# Patient Record
Sex: Female | Born: 1975 | Race: Black or African American | Hispanic: No | Marital: Single | Smoking: Never smoker
Health system: Southern US, Community
[De-identification: ages and names within clinical notes are randomized; demographics above are authoritative.]

## PROBLEM LIST (undated history)

## (undated) ENCOUNTER — Emergency Department: Discharge status: Absent without leave | Payer: Self-pay

## (undated) DIAGNOSIS — J189 Pneumonia, unspecified organism: Secondary | ICD-10-CM

## (undated) DIAGNOSIS — I2510 Atherosclerotic heart disease of native coronary artery without angina pectoris: Secondary | ICD-10-CM

## (undated) DIAGNOSIS — M1990 Unspecified osteoarthritis, unspecified site: Secondary | ICD-10-CM

## (undated) DIAGNOSIS — I10 Essential (primary) hypertension: Secondary | ICD-10-CM

## (undated) DIAGNOSIS — F32A Depression, unspecified: Secondary | ICD-10-CM

## (undated) DIAGNOSIS — F329 Major depressive disorder, single episode, unspecified: Secondary | ICD-10-CM

## (undated) HISTORY — PX: SHOULDER ARTHROSCOPY W/ ROTATOR CUFF REPAIR: SHX2400

## (undated) HISTORY — DX: Major depressive disorder, single episode, unspecified: F32.9

## (undated) HISTORY — DX: Unspecified osteoarthritis, unspecified site: M19.90

## (undated) HISTORY — DX: Pneumonia, unspecified organism: J18.9

## (undated) HISTORY — DX: Essential (primary) hypertension: I10

## (undated) HISTORY — DX: Depression, unspecified: F32.A

## (undated) HISTORY — PX: CARDIAC CATHETERIZATION: SHX172

## (undated) HISTORY — PX: ABDOMINAL HYSTERECTOMY: SHX81

---

## 1997-12-16 ENCOUNTER — Emergency Department (HOSPITAL_COMMUNITY): Admission: EM | Admit: 1997-12-16 | Discharge: 1997-12-16 | Payer: Self-pay | Admitting: Emergency Medicine

## 1999-07-20 ENCOUNTER — Emergency Department (HOSPITAL_COMMUNITY): Admission: EM | Admit: 1999-07-20 | Discharge: 1999-07-20 | Payer: Self-pay | Admitting: Emergency Medicine

## 1999-07-20 ENCOUNTER — Encounter: Payer: Self-pay | Admitting: Emergency Medicine

## 2002-05-29 ENCOUNTER — Emergency Department (HOSPITAL_COMMUNITY): Admission: EM | Admit: 2002-05-29 | Discharge: 2002-05-29 | Payer: Self-pay | Admitting: Emergency Medicine

## 2002-10-27 ENCOUNTER — Emergency Department (HOSPITAL_COMMUNITY): Admission: EM | Admit: 2002-10-27 | Discharge: 2002-10-28 | Payer: Self-pay | Admitting: Emergency Medicine

## 2003-02-08 ENCOUNTER — Emergency Department (HOSPITAL_COMMUNITY): Admission: EM | Admit: 2003-02-08 | Discharge: 2003-02-08 | Payer: Self-pay | Admitting: Emergency Medicine

## 2003-05-01 ENCOUNTER — Inpatient Hospital Stay (HOSPITAL_COMMUNITY): Admission: EM | Admit: 2003-05-01 | Discharge: 2003-05-02 | Payer: Self-pay | Admitting: Emergency Medicine

## 2003-12-05 ENCOUNTER — Emergency Department (HOSPITAL_COMMUNITY): Admission: EM | Admit: 2003-12-05 | Discharge: 2003-12-05 | Payer: Self-pay | Admitting: *Deleted

## 2004-12-11 ENCOUNTER — Emergency Department (HOSPITAL_COMMUNITY): Admission: EM | Admit: 2004-12-11 | Discharge: 2004-12-11 | Payer: Self-pay | Admitting: Family Medicine

## 2005-08-02 ENCOUNTER — Encounter: Admission: RE | Admit: 2005-08-02 | Discharge: 2005-08-02 | Payer: Self-pay | Admitting: Internal Medicine

## 2005-09-18 ENCOUNTER — Ambulatory Visit (HOSPITAL_COMMUNITY): Admission: RE | Admit: 2005-09-18 | Discharge: 2005-09-20 | Payer: Self-pay | Admitting: Obstetrics and Gynecology

## 2005-09-18 ENCOUNTER — Encounter (INDEPENDENT_AMBULATORY_CARE_PROVIDER_SITE_OTHER): Payer: Self-pay | Admitting: *Deleted

## 2005-11-03 ENCOUNTER — Ambulatory Visit (HOSPITAL_COMMUNITY): Admission: RE | Admit: 2005-11-03 | Discharge: 2005-11-03 | Payer: Self-pay | Admitting: Obstetrics and Gynecology

## 2005-12-19 ENCOUNTER — Encounter: Admission: RE | Admit: 2005-12-19 | Discharge: 2005-12-19 | Payer: Self-pay | Admitting: Internal Medicine

## 2006-06-18 ENCOUNTER — Emergency Department (HOSPITAL_COMMUNITY): Admission: EM | Admit: 2006-06-18 | Discharge: 2006-06-18 | Payer: Self-pay | Admitting: Emergency Medicine

## 2006-07-16 ENCOUNTER — Ambulatory Visit (HOSPITAL_COMMUNITY): Admission: RE | Admit: 2006-07-16 | Discharge: 2006-07-16 | Payer: Self-pay | Admitting: *Deleted

## 2006-07-16 ENCOUNTER — Encounter (INDEPENDENT_AMBULATORY_CARE_PROVIDER_SITE_OTHER): Payer: Self-pay | Admitting: Specialist

## 2006-07-22 ENCOUNTER — Ambulatory Visit (HOSPITAL_COMMUNITY): Admission: RE | Admit: 2006-07-22 | Discharge: 2006-07-22 | Payer: Self-pay | Admitting: *Deleted

## 2006-08-07 ENCOUNTER — Ambulatory Visit (HOSPITAL_COMMUNITY): Admission: RE | Admit: 2006-08-07 | Discharge: 2006-08-07 | Payer: Self-pay | Admitting: Cardiology

## 2007-06-22 IMAGING — CT CT ABDOMEN W/ CM
3 of 5 series · 15 of 46 positions shown, 20 images · IV contrast (omnipaque)
Comparison: None.

CLINICAL DATA: Lower chest and upper abdominal pain.   
 ABDOMEN CT WITH CONTRAST:
TECHNIQUE: Multidetector CT imaging of the abdomen was performed following the standard protocol during bolus administration of intravenous contrast.
 Contrast:  100 cc Omnipaque 300.

[Series 2: abdomen · axial · 0.72mm/px · z∈[-262,-56]mm · 11 of 49 slices shown, 16 images]
[im 4/49  soft-tissue]
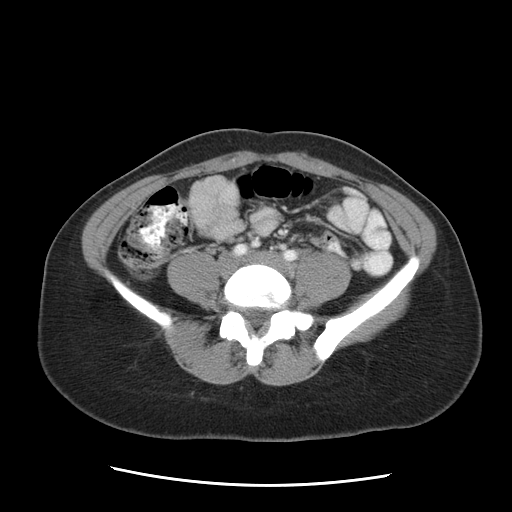
[im 4/49  bone]
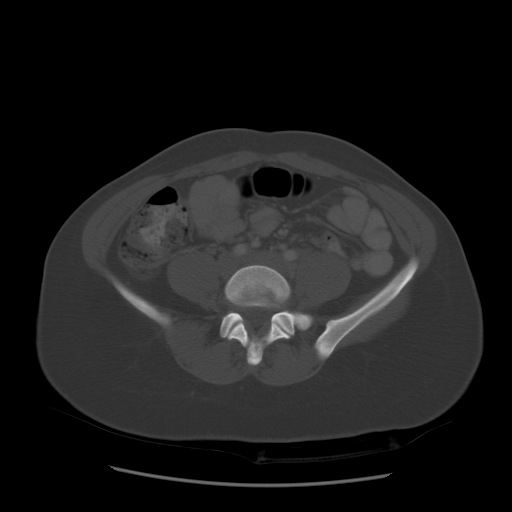
[im 10/49  soft-tissue]
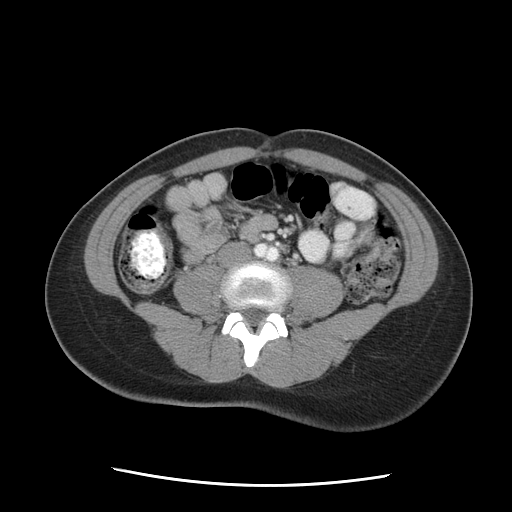
[im 13/49  soft-tissue]
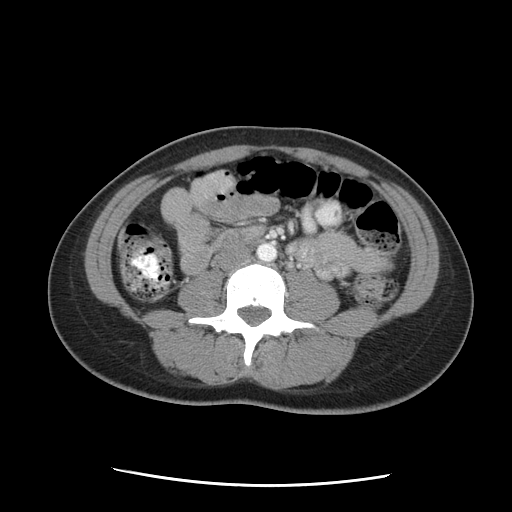
[im 17/49  soft-tissue]
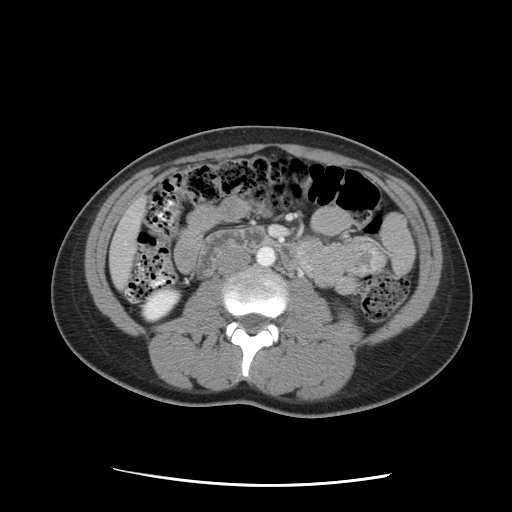
[im 23/49  soft-tissue]
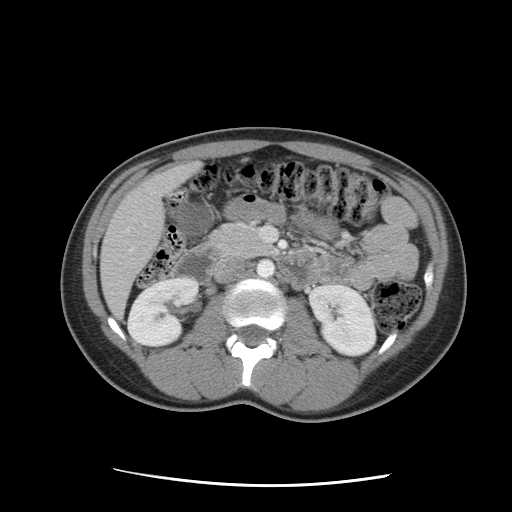
[im 26/49  soft-tissue]
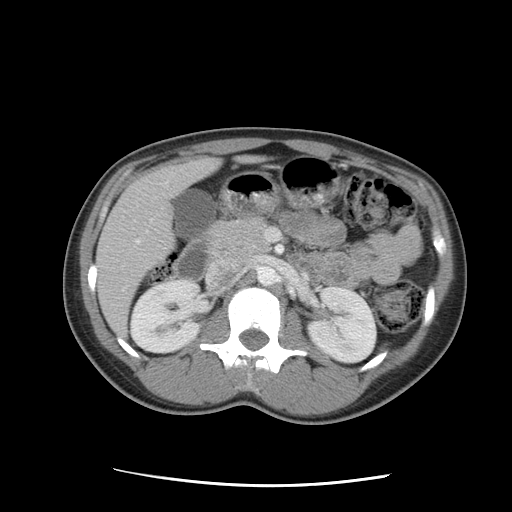
[im 33/49  soft-tissue]
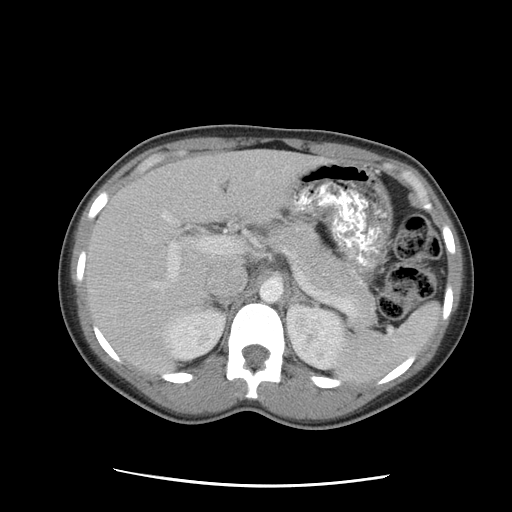
[im 36/49  soft-tissue]
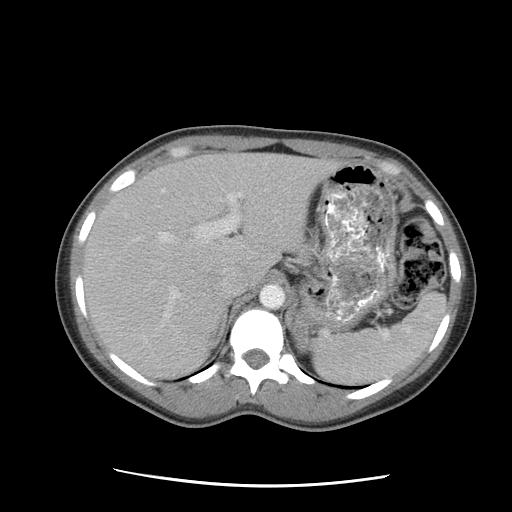
[im 36/49  lung]
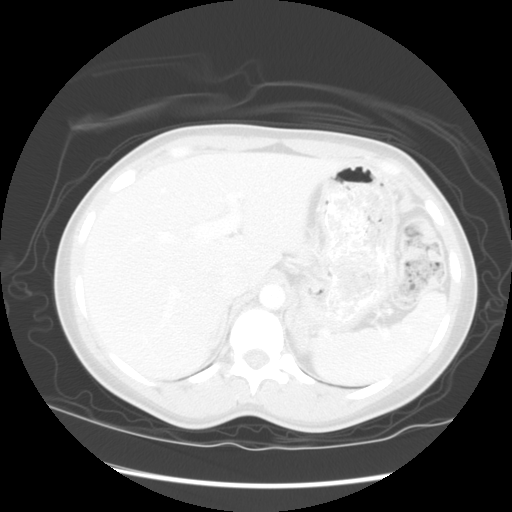
[im 39/49  soft-tissue]
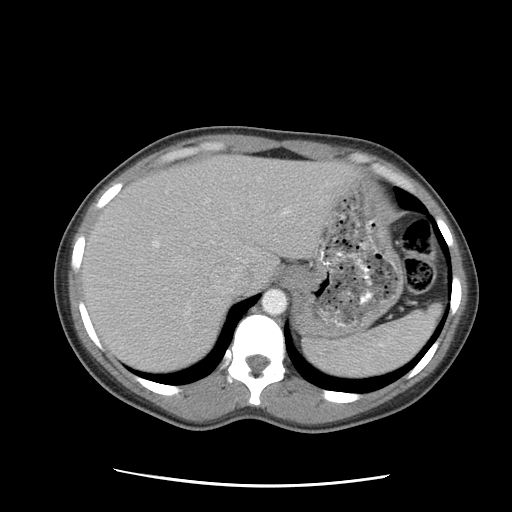
[im 39/49  lung]
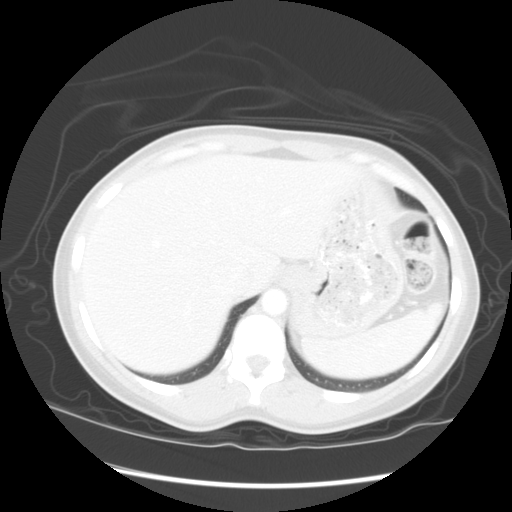
[im 39/49  bone]
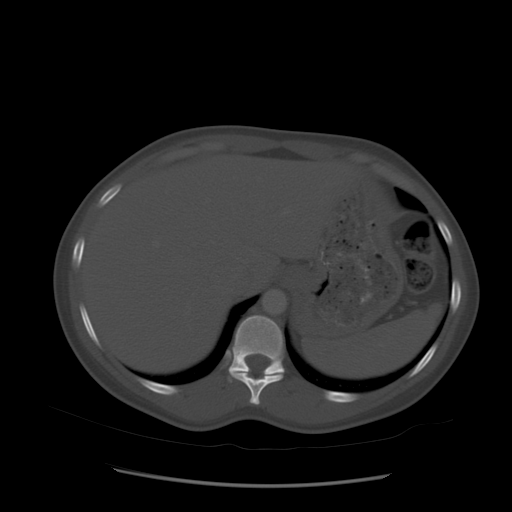
[im 42/49  lung]
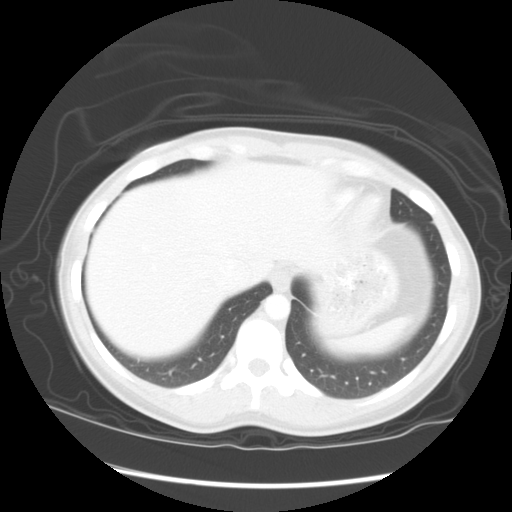
[im 45/49  soft-tissue]
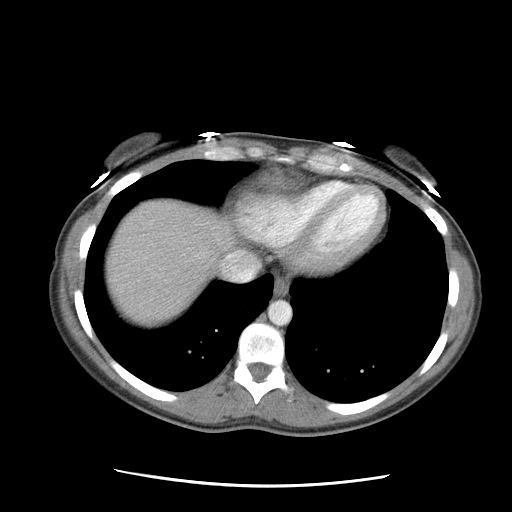
[im 45/49  lung]
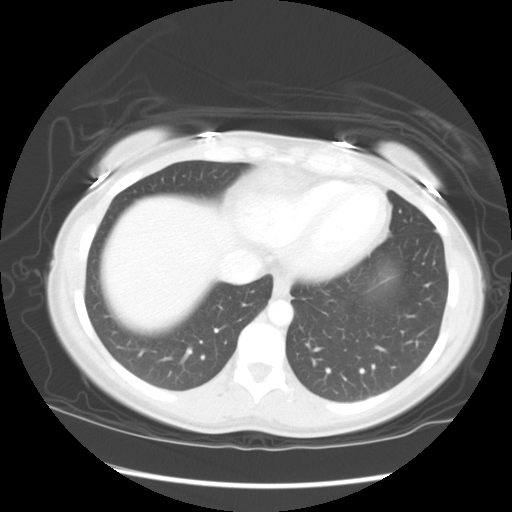

[Series 400: reformatted · sagittal · 0.72mm/px · 1 of 146 slices shown (1 of 2)]
[im 49/146  soft-tissue]
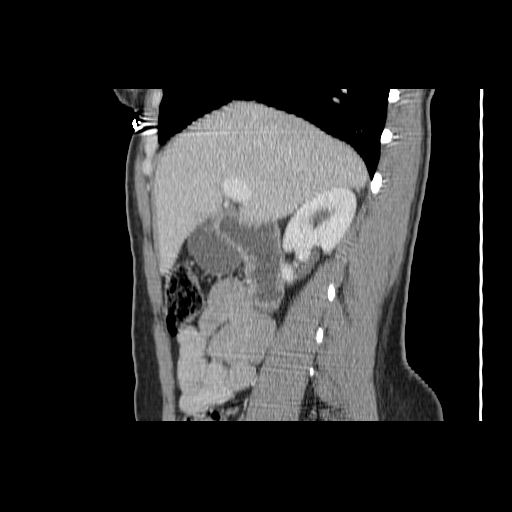

[Series 401: reformatted · coronal · 0.72mm/px · 3 of 104 slices shown (2 of 2)]
[im 35/104  soft-tissue]
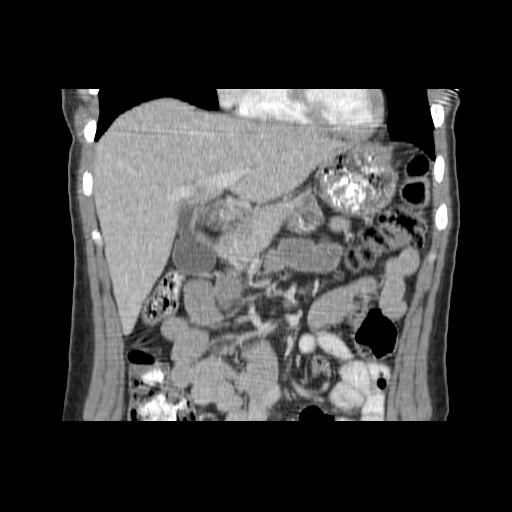
[im 46/104  soft-tissue]
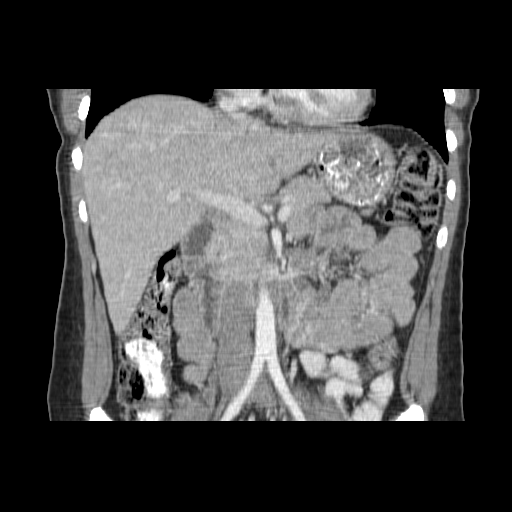
[im 58/104  soft-tissue]
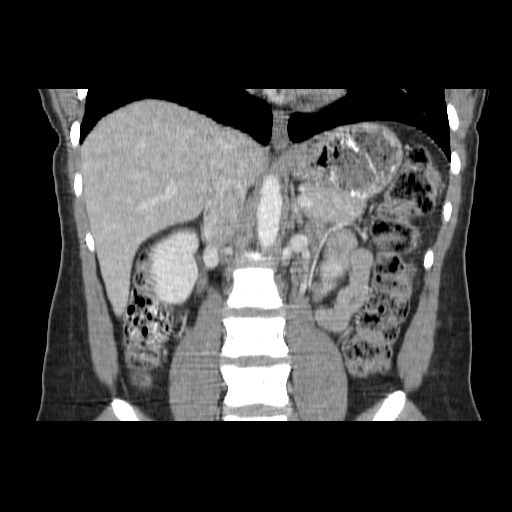

[15 of 46 positions shown; findings below may reference images not displayed]

FINDINGS: The liver, spleen, pancreas, adrenal glands, and kidneys appear normal.   There is a large amount of stool in the colon.   No mesenteric or retroperitoneal abnormality.  No mass or free fluid.
IMPRESSION: Unremarkable abdominal CT scan.  Constipation.

## 2007-07-08 ENCOUNTER — Other Ambulatory Visit: Admission: RE | Admit: 2007-07-08 | Discharge: 2007-07-08 | Payer: Self-pay | Admitting: Internal Medicine

## 2007-10-10 ENCOUNTER — Emergency Department (HOSPITAL_COMMUNITY): Admission: EM | Admit: 2007-10-10 | Discharge: 2007-10-10 | Payer: Self-pay | Admitting: Emergency Medicine

## 2008-03-14 ENCOUNTER — Emergency Department (HOSPITAL_COMMUNITY): Admission: EM | Admit: 2008-03-14 | Discharge: 2008-03-14 | Payer: Self-pay | Admitting: Emergency Medicine

## 2008-06-23 ENCOUNTER — Emergency Department (HOSPITAL_COMMUNITY): Admission: EM | Admit: 2008-06-23 | Discharge: 2008-06-23 | Payer: Self-pay | Admitting: Emergency Medicine

## 2010-11-15 NOTE — Op Note (Signed)
Michele Frazier, Michele Frazier              ACCOUNT NO.:  [id number]   MEDICAL RECORD NO.:  [id number]          PATIENT TYPE:  AMB   LOCATION:  DAY                          FACILITY:  [facility]   PHYSICIAN:  Malachi Pro. Ambrose Mantle, M.D. DATE OF BIRTH:  [birth year]   DATE OF PROCEDURE:  09/18/2005  DATE OF DISCHARGE:                                 OPERATIVE REPORT   PREOPERATIVE DIAGNOSES:  Leiomyomata uteri, menorrhagia, dysmenorrhea,  anemia.   POSTOPERATIVE DIAGNOSES:  Leiomyomata uteri, menorrhagia, dysmenorrhea,  anemia.   OPERATION:  Vaginal hysterectomy.   OPERATOR:  Malachi Pro. Ambrose Mantle, M.D.   ASSISTANT:  Zenaida Niece, M.D.   ANESTHESIA:  General.   DESCRIPTION OF PROCEDURE:  The patient was brought to the operating room and  placed under satisfactory general anesthesia. She was placed in lithotomy  position in the [place] stirrups. The abdomen, vulva, vagina and urethra were  prepped with Betadine solution. The urethra was catheterized with a Foley  catheter and hooked to straight drain. The exam revealed the uterus to be  multinodular, enlarged, the exact size of the uterus was hard to tell. The  adnexa seemed to be free of masses and the cul-de-sac felt smooth except for  slight irregularities but I did not think the rectum was adherent to the  lower part of the uterus. A weighted speculum was placed posteriorly, Deaver  retractor anteriorly and the cervicovaginal junction was at injected with a  dilute solution of Neo-Synephrine circumferentially. It was noted at this  point that the vagina was quite tight, the patient does have a 35 year old  child but she is homosexual and has no vaginal penile sex. A circumferential  incision was made around the cervix, the bladder was pushed anteriorly, the  posterior cul-de-sac was identified and entered with sharp dissection. A lot  of clear fluid was obtained. The slight nodularity in the posterior cul-de-  sac was secondary to small fibroids  on the posterior lower uterine segment  rather than anything in the cul-de-sac or on the rectum. The uterosacral  ligaments were bilaterally clamped, cut and suture ligated and held. The  cardinal ligaments were clamped, cut and suture ligated. I continued up the  sides of the uterus farther with clamping, cutting and suture ligating  technique. All suture was #0 Vicryl. The tissue under the bladder was cut  across the lower part of the uterus and the bladder was pushed anteriorly  and then by clamping and cutting and suture ligating higher up, I entered  the peritoneal cavity anteriorly. I continued up the sides of the uterus  farther but by using towel clips across the posterior part of the uterus,  the uterus did not budge so I had to continue going up the sides of the  broad ligament and finally I shelled out about a 3 cm fibroid on the right  anterior lower part of the uterus to get more exposure. This did provide  more exposure and I continued up the sides of the uterus and then was able  to invert the uterus in the incision in the cul-de-sac. The uterus  was quite  large, multinodular with many fibroids. The uterus had been removed. The  upper pedicles were clamped, cut and doubly suture ligated. Additional  sutures were required for complete hemostasis. The posterior vaginal cuff  was run with a locked suture of zero Vicryl. In spite of this, there were a  couple of extra bleeding sites that had to be sutured on the posterior cuff.  At this point, hemostasis appeared adequate, both tubes and ovaries appeared  normal. The pursestring suture of #1 Vicryl was started at 12 o'clock and  included the anterior peritoneum, the left upper pedicle, the left  uterosacral ligament, the posterior cul-de-sac peritoneum, the right  uterosacral ligament and the right upper pedicle and back to the anterior  peritoneum. I then sutured the uterosacral ligaments together in the midline  above the  pursestring suture then took one more look for hemostasis and  hemostasis was adequate. I pushed the bowel and ovaries away from the  pursestring suture and tied the pursestring suture down. I then tied the  uterosacral ligaments together in the midline and closed the vaginal cuff  vertically with interrupted figure-of-eight sutures of zero Vicryl.There  were two lacerations of the vagina caused by the retractors One required  suturing and this was done with 0 vicryl suture. The patient seemed to  tolerate the procedure well. The anesthetist estimated the blood loss at  100, I suggested 200. Sponge and needle counts were correct and the patient  was returned to recovery in satisfactory condition.      Malachi Pro. Ambrose Mantle, M.D.  Electronically Signed     TFH/MEDQ  D:  09/18/2005  T:  09/19/2005  Job:  [id number]

## 2010-11-15 NOTE — H&P (Signed)
Michele Frazier, Michele Frazier              ACCOUNT NO.:  [id number]   MEDICAL RECORD NO.:  [id number]          PATIENT TYPE:  AMB   LOCATION:  DAY                          FACILITY:  [facility]   PHYSICIAN:  Malachi Pro. Ambrose Mantle, M.D. DATE OF BIRTH:  1975/12/15   DATE OF ADMISSION:  09/18/2005  DATE OF DISCHARGE:                                HISTORY & PHYSICAL   PRESENT ILLNESS:  This is a 35 year old black female, para 1-0-1-1, who was  admitted to the hospital for hysterectomy because of leiomyomata uteri,  severe dysmenorrhea, severe menorrhagia, and anemia.  Last menstrual period,  September 05, 2005.  The patient's periods occur at slightly irregular intervals,  last 5-10 days, and are quite heavy in flow with severe pain.  The patient  states that approximately 2004, she noted the onset of severe menstrual  cramps and she rates the cramps at 11 out of 10 on a 1 to 10 scale.  She  states that her flow is very heavy, using 15-18 pads per day and passing  $0.50-size clots.  Her child was born in 66 and since 1998, there has been  no pregnancy.  She has used condoms but in the last three years she has been  in a same sex relationship.  She has no desire for future childbearing and  considers herself homosexual.  An ultrasound done, on August 25, 2005,  showed at least 10 fibroids.  The endometrial thickness was 5.10-cm and even  though this is 0.1-cm above the limit for confirming benignity of the  endometrium, I did not do a biopsy of her uterine cavity.  The patient's  ovaries appeared normal on the ultrasound.  The patient is admitted now for  hysterectomy.  She understands that the surgery could be avoided if she were  desperate to have another child.  She has no interest in future childbearing  and as I said earlier, considers herself homosexual.   PAST MEDICAL HISTORY:  No known allergies.   MEDICATIONS:  Benicar and Toprol.   ILLNESSES:  High blood pressure.  She does have heart  palpitations.   She has had no operations.   She rarely drinks alcohol and does not smoke.   Mother is 16 with high blood pressure and heart problems.  Father 51 with  high blood pressure.  One sister has a heart murmur and one brother is  living and well.   PHYSICAL EXAMINATION:  GENERAL:  A well-developed, well-nourished black  female in no acute distress.  VITAL SIGNS:  Blood pressure 132/68, pulse is 70, weight is 160 pounds.  HEENT:  Reveal no cranial abnormalities.  Extraocular movements are intact.  Nose and pharynx are clear.  NECK:  Supple without thyromegaly.  HEART:  Normal size and sounds.  No murmurs.  LUNGS:  Clear to auscultation.  BREASTS:  Soft without masses.  ABDOMEN:  Soft, not tender.  No masses are palpable.  There is a tattoo in  the left lower quadrant.  The liver, spleen, and kidneys are not felt.  PELVIC:  Vulva and vagina are clean.  The BUS is  negative.  The cervix is  clean.  The uterus is movable, irregular with fibroids.  The cul-de-sac  feels free.  There might be a small irregularity in the cul-de-sac but I  feel no evidence that the rectum is adherent to the lower uterine segment  posteriorly.  Adnexa are clear.   ADMITTING IMPRESSION:  1.  Menorrhagia.  2.  Dysmenorrhea.  3.  Fibroids.  4.  Anemia.   The patient has been placed on iron for the last six weeks or so, since her  hemoglobin was found to be 10.9, hematocrit 33 on August 11, 2005.  Her  pre-op hemoglobin has not increased, so it is possible that she does not  have iron-deficiency.  The patient is prepared for surgery.  She understands  the risks of surgery include, but are not limited to, heart attack, stroke,  pulmonary embolus, wound disruption, hemorrhage with need for reoperation  and/or transfusion, fistula formation, nerve injury, intestinal obstruction.  She understands and agrees to proceed.      Malachi Pro. Ambrose Mantle, M.D.  Electronically Signed     TFH/MEDQ  D:   09/17/2005  T:  09/17/2005  Job:  [id number]

## 2010-11-15 NOTE — Discharge Summary (Signed)
NAME:  Michele Frazier, Michele Frazier                        ACCOUNT NO.:  [id number]   MEDICAL RECORD NO.:  [id number]                   PATIENT TYPE:  INP   LOCATION:  2003                                 FACILITY:  [facility]   PHYSICIAN:  Charlton Haws, M.D.                  DATE OF BIRTH:  20-May-1976   DATE OF ADMISSION:  05/01/2003  DATE OF DISCHARGE:  05/02/2003                           DISCHARGE SUMMARY - REFERRING   DISCHARGE DIAGNOSIS:  Atypical chest pain with pressure sensation the last  few days prior to this admission. Cardiac enzymes negative x3.  Electrocardiogram nondiagnostic.   SECONDARY DIAGNOSES:  1. Hiatal hernia status post esophagogastroduodenoscopy.  2. Hypertension.  3. Positive family history of coronary artery disease.   PROCEDURE:  Computed tomogram of the chest, study results are pending at the  time of this discharge.   DISCHARGE DISPOSITION:  Michele Frazier is ready for discharge November  2. She is not having any further chest pain. She has been maintained on her  home medications of Toprol, Pepcid. Cardiac enzymes x3 are negative. She is  ready for discharge today November 2. She will have an outpatient exercise  study. This is to be performed Thursday May 11, 2003 at 11:30 in the  morning. She is counseled not to have anything to eat Wednesday November 10  and to hold off on taking her Toprol-XL the morning of November 11. She will  also have a 2-D echocardiogram at [facility] office  Tuesday May 09, 2003 at 3:30 in the afternoon. The patient will follow  up with Dr. Eden Emms Wednesday, May 17, 2003 at 9:00 in the morning.   DISCHARGE MEDICATIONS:  She goes home on the following medications:  1. Enteric-coated aspirin 325 mg daily.  2. Toprol-XL 50 mg daily.  3. Pepcid 20 mg daily.   DISCHARGE DIET:  Low sodium, low cholesterol diet.   BRIEF HISTORY:  Michele Frazier is a 35 year old female who has presented to  [facility] emergency room with chest pain. Her primary care giver  is Dr. Frederich Chick. She presented with sharp chest pain over the past few days  with pressure sensation. She has no prior cardiac history. She does have a  history of nausea and some esophageal type pain. She has not had a previous  cardiac catheterization. She had a previous EGD for hiatal hernia. Coronary  risk factors include hypertension, positive family history of coronary  artery disease. She is on Toprol 50 mg XL and some medication for nausea.  Does not apparently tolerate Nexium. Her pain is described as positional and  sometimes exertional. She denies tobacco or drug history. She reports not  being pregnant but a pregnancy will be checked. This level is also pending.   HOSPITAL COURSE:  After admission November 1 for chest pain with pressure  sensation which has been occurring over the last few days prior  to this  admission, cardiac enzymes x3 were taken. A computed tomogram of the chest  was also performed. Pregnancy evaluation performed. The CT of the chest and  the pregnancy evaluation are pending. Cardiac enzymes are as follows:  November 1 at 11:41 a.m. myoglobin 43.9, CK-MB less than 1.0, troponin I  less than 0.05; on November 1 at 16:15 hours, CK of 69, CK-MB 0.5, troponin  I less than 0.01; on November 2 in the morning, CK was 63, CK-MB 0.3,  troponin I less than 0.01. The patient's weight is 160 pounds. Chest x-ray  in the emergency room showed no acute abnormality. Admission electrolytes on  November 1:  Sodium 138, potassium 3.8, chloride 108, carbon dioxide 26,  glucose 93, BUN 15, creatinine 0.9; alkaline phosphatase 46, SGOT 18, SGPT  10. ESR is 6. It does not look as if a complete blood count was obtained.  Hemoglobin, however, is 13, hematocrit 39%.      [name], P.A.                    Charlton Haws, M.D.    GM/MEDQ  D:  05/02/2003  T:  05/02/2003  Job:  [id number]   cc:   Dan Humphreys, M.D.   [facility]   Frederich Chick, M.D.

## 2010-11-15 NOTE — Cardiovascular Report (Signed)
NAMEMELISA, DONOFRIO NO.:  [id number]   MEDICAL RECORD NO.:  [id number]          PATIENT TYPE:  OIB   LOCATION:  2854                         FACILITY:  [facility]   PHYSICIAN:  Antionette Char, MD    DATE OF BIRTH:  May 02, 1976   DATE OF PROCEDURE:  08/07/2006  DATE OF DISCHARGE:  08/07/2006                            CARDIAC CATHETERIZATION   PROCEDURES:  1. Left heart catheterization  2. Coronary cineangiography.  3. Left ventricular cineangiography.  4. Angio-Seal of the right femoral artery  5. Intracoronary nitroglycerin in right coronary artery.   INDICATIONS FOR PROCEDURE:  This 35 year old female has a very strong  family history of coronary artery disease with her mother dying suddenly  with a massive heart attack at age 7. She was a nonsmoker.  The patient  recently had the onset of chest pain for two months and underwent a  presenting Cardiolite study which showed evidence for reversible  myocardial ischemia.  She was then scheduled for cardiac cath because of  her strong family history, her recent onset of chest pain, and abnormal  cardiac stress test.   DESCRIPTION OF PROCEDURE:  After signing an informed consent, the  patient was premedicated with 5 mg of Valium by mouth and brought to the  cardiac catheterization lab at [facility].  Her right groin was  prepped and draped in a sterile fashion and anesthetized locally with 1%  lidocaine.  A 6-French introducer sheath was inserted percutaneously  into the right femoral artery.  6-French #4 Judkins coronary catheters  were used to make injections into the native coronary arteries.  The 6-  French right coronary catheter tip caused significant spasm in the  proximal right coronary artery and this catheter was exchanged for a 4-  [place] Judkins right coronary catheter. After engaging the ostium of the  right coronary artery with a 4-French catheter, injections were again  made noting the  spasm and this was followed by 200 mcg of intracoronary  nitroglycerin injection. Following the injection, the spasm was relieved  and further injection showed normalization of the artery with normal  flow and normal runoff.  A 6-French pigtail catheter was used to measure  pressures in the left ventricle and aorta and to make a midstream  injection into the left ventricle.  The patient tolerated the procedure  well and no complications were noted. At the end of the procedure, the  catheter and sheath were removed from the right femoral artery and  hemostasis was easily obtained with an Angio-Seal closure system.   MEDICATIONS GIVEN:  Nitroglycerin intracoronary artery right coronary  artery 200 mcg.   HEMODYNAMIC DATA:  There was no gradient across the aortic valve.   CINE FINDINGS:  Coronary cineangiography:  Left coronary artery showed  the ostium and left main appear normal. Left anterior descending appears  normal. Circumflex coronary artery appears normal.  The right coronary  artery initial injections showed spasm at the tip of the right coronary  catheter.  Otherwise, the right coronary artery was normal in  appearance. Further follow-up injections showed changing of the  6-French  catheter to a 4-French catheter and following nitroglycerin injection,  the cine showed normalization of the proximal segment without residual  spasm and with normal appearing vessel with normal antegrade flow and  normal distal runoff.   LEFT VENTRICULAR CINE ANGIOGRAM:  Left ventricular chamber size,  contractility, and wall thickness appear normal.  The left ventricular  ejection fraction is very normal with an ejection fraction estimated at  70-80%. The mitral and aortic valves appear normal.   FINAL DIAGNOSIS:  1. Normal coronary arteries.  2. Catheter tip induced spasm in the proximal right coronary artery      totally relieved with intracoronary nitroglycerin.  3. Normal left ventricular  function.  4. Successful Angio-Seal of the right femoral artery.   DISPOSITION:  Will monitor in the holding area and short stay until  stable and then discharge to home.  Will arrange follow-up in the office  with Dr. Ricki Miller for continued medical treatment and evaluation.      Antionette Char, MD  Electronically Signed     JRT/MEDQ  D:  08/07/2006  T:  08/08/2006  Job:  [id number]

## 2010-11-15 NOTE — Consult Note (Signed)
NAME:  Michele Frazier, Michele Frazier                        ACCOUNT NO.:  [id number]   MEDICAL RECORD NO.:  [id number]                   PATIENT TYPE:  EMS   LOCATION:  MAJO                                 FACILITY:  [facility]   PHYSICIAN:  Charlton Haws, M.D.                  DATE OF BIRTH:  12-17-1975   DATE OF CONSULTATION:  DATE OF DISCHARGE:                                   CONSULTATION   Michele Frazier is a 35 year old patient seen in the nonurgent care part of the  ER for chest pain.  Her primary care M.D. is Dr. Frederich Chick.  She presented  with sharp chest pain over the last few days with a pressure sensation.   She has no previous cardiac history.  She does have a history of some nausea  and esophageal type pain.   She has not had a previous catheterization before.  She has had previous EGD  for hiatal hernia.   Coronary risk factors include hypertension, positive family history for  coronary disease.  She was on Toprol 50 mg XL and some medication for  nausea.  She apparently does not tolerate Nexium.  [place]  She is currently on disability.   Her pain is described as positional and/or exertional.   She denies any tobacco or drug history.  She reports not being pregnant but  we will check a pregnancy level.   PHYSICAL EXAMINATION:  GENERAL APPEARANCE:  She is in no distress.  There is  a tattoo on the right arm.  VITAL SIGNS:  Blood pressure is 133/73, pulse is 60 and regular.  NECK:  Carotids are normal.  LUNGS:  Clear.  CARDIOVASCULAR:  There is an S1 and S2 with normal heart sounds.  There is  no murmur, rub, gallop or click.  ABDOMEN:  Benign.  EXTREMITIES:  Intact pulses with no edema.   Blood work is unremarkable.   Her EKG shows sinus rhythm with occasional PAC and PVC.  One EKG shows  question of an old anterior septal infarct but I believe that her chest  leads are a little bit high.  The patient's chest x-ray shows no active   disease.   IMPRESSION:  Somewhat atypical chest pain in a young person.  Will get a CT  scan to rule out other chest pathology in regard to her sharp pain.   She will have a pregnancy test before this.  Will do a 2-D echocardiogram  and I would be surprised if there is wall motion abnormality.  If she rules  out and her echo and CT scan are normal, I think she can be discharged in  the morning for an outpatient Cardiolite study.   We will continue her beta-blocker.   Further recommendations will be based on the results of these tests.  Charlton Haws, M.D.    PN/MEDQ  D:  05/01/2003  T:  05/01/2003  Job:  [id number]

## 2010-11-15 NOTE — Discharge Summary (Signed)
NAMEAVALEY, COOP NO.:  [id number]   MEDICAL RECORD NO.:  [id number]          PATIENT TYPE:  INP   LOCATION:  1613                         FACILITY:  [facility]   PHYSICIAN:  Malachi Pro. Ambrose Mantle, M.D. DATE OF BIRTH:  [birth year]   DATE OF ADMISSION:  09/18/2005  DATE OF DISCHARGE:  09/20/2005                                 DISCHARGE SUMMARY   This is a 35 year old black female admitted to the hospital for hysterectomy  because of leiomyomata uteri, menorrhagia, dysmenorrhea, and anemia.  The  patient underwent a vaginal hysterectomy with one fibroid morcellated out on  September 18, 2005, under general anesthesia.  The procedure was somewhat long  because the patient had a somewhat tight vagina probably owing to the fact  that she has been homosexual for years and the uterus was quite large.  The  uterus weighed 357 grams but was removed intact except for the 1 fibroid  that was enucleated to get better exposure on the right side of the broad  ligament.  Postoperatively, the patient did well.  She was considered a  candidate for discharge on the first postop day, but she did not feel well  enough to feel good about going home.  She was kept until the second postop  day, and now she feels very well and is ready for discharge.  She is  ambulating well, voiding well without difficulty, tolerating a liquid diet.  She has not passed flatus, but her abdomen is soft and nontender.  Comprehensive metabolic panel showed no abnormality except a glucose of 114  and a bilirubin of 1.7.  Initial hemoglobin 10.2, hematocrit 31.2, white  count 4700, platelet count 220,000, 51 segs, 38 lymphs, 10 monos, 1  eosinophil.  Urine pregnancy test was negative.  Follow-up hematocrits were  30.1 and 29.1.  On 3 February2007, the patient had undergone an MRI brain  scan with and without contrast because of headaches, nausea, and vomiting  and history of hypertension.  It showed no acute stroke, no  abnormal  intracranial enhancement, scattered punctate foci of mildly abnormal signal  in the subcortical white matter, left greater than right, complicated  migraine not excluded, vasculitis, small vessel disease, and MS appear  unlikely.  Chest x-ray showed no active cardiopulmonary disease.  An EKG  showed normal sinus rhythm.  The patient had undergone a stress Cardiolite  study in November2004, and in walking on a treadmill, there was no  significant chest pain and no diagnostic electrocardiogram changes.  Path  report has not been entered in the chart but by the computer shows that the  cervix, endometrium, and at least 15 leiomyomas were benign.  Uterine weight  was 357 grams.   FINAL DIAGNOSES:  1.  Leiomyomata uteri.  2.  Menorrhagia.  3.  Dysmenorrhea.  4.  Anemia.   OPERATION:  Vaginal hysterectomy.   FINAL CONDITION:  Improved.   Instructions include our regular discharge instructions.  No vaginal  entrance, no heavy lifting or strenuous activity, liquid diet until passing  flatus and then advance to a regular diet.  Call with  any temperature  elevation greater than 100.4 degrees.  Call with any heavy vaginal bleeding,  return to the office in 10-14 days for follow-up examination.  Percocet  5/325, 30 tablets 1-2 q.4-6h. as needed for pain is given at discharge.      Malachi Pro. Ambrose Mantle, M.D.  Electronically Signed     TFH/MEDQ  D:  09/20/2005  T:  09/23/2005  Job:  [id number]

## 2010-11-15 NOTE — Op Note (Signed)
Michele Frazier, Michele Frazier NO.:  [id number]   MEDICAL RECORD NO.:  [id number]          PATIENT TYPE:  AMB   LOCATION:  ENDO                         FACILITY:  [facility]   PHYSICIAN:  Georgiana Spinner, M.D.    DATE OF BIRTH:  1975/11/05   DATE OF PROCEDURE:  07/16/2006  DATE OF DISCHARGE:                               OPERATIVE REPORT   SURGEON:  Georgiana Spinner, M.D.   PROCEDURE:  Upper endoscopy.   INDICATIONS:  Abdominal pain   ANESTHESIA:  Fentanyl 60 mcg, Versed 6 mg.   PROCEDURE:  With the patient mildly sedated in the left lateral  decubitus position, the Pentax videoscopic endoscope was inserted in the  mouth and passed under direct vision through the esophagus, which  appeared normal, except there was 1 tongue of tissue that could be  Barrett or normal, which I could not determined; so I elected to  photograph and biopsy this.  We entered into the stomach.  The fundus,  body, antrum, duodenal bulb and second portion of the duodenum appeared  normal. From this point, the endoscope was slowly withdrawn, taking  circumferential views of the duodenal mucosa, until the endoscope was  then pulled back into the stomach and placed in retroflexion to view the  stomach from below.  The endoscope was straightened and withdrawn,  taking circumferential views of the remaining gastric and esophageal  mucosa.  The patient's vital signs and pulse oximetry remained stable.  The patient tolerated the procedure well without apparent complication.   FINDINGS:  Question of Barrett esophagus versus normal biopsy.   PLAN:  Await biopsy report.  The patient will call me for results and  follow up with me as an outpatient.  Will all schedule CT scan of the  abdomen to evaluate further.           ______________________________  Georgiana Spinner, M.D.     GMO/MEDQ  D:  07/16/2006  T:  07/16/2006  Job:  [id number]

## 2011-03-25 LAB — DIFFERENTIAL
Basophils Absolute: 0
Basophils Relative: 0
Eosinophils Absolute: 0.2
Eosinophils Relative: 3
Lymphocytes Relative: 40
Lymphs Abs: 2
Monocytes Absolute: 0.4
Monocytes Relative: 7
Neutro Abs: 2.4
Neutrophils Relative %: 50

## 2011-03-25 LAB — BASIC METABOLIC PANEL
BUN: 8
CO2: 28
Calcium: 9.1
Chloride: 107
Creatinine, Ser: 0.87
GFR calc Af Amer: >60
GFR calc non Af Amer: >60
Glucose, Bld: 99
Potassium: 3.8
Sodium: 142

## 2011-03-25 LAB — POCT CARDIAC MARKERS
CKMB, poc: <1 — ABNORMAL LOW
Myoglobin, poc: 28.7
Operator id: 4074
Troponin i, poc: <0.05

## 2011-03-25 LAB — CBC
HCT: 38.3
Hemoglobin: 13
MCHC: 33.9
MCV: 91.9
Platelets: 293
RBC: 4.17
RDW: 13.1
WBC: 5

## 2011-03-25 LAB — D-DIMER, QUANTITATIVE: D-Dimer, Quant: <0.22

## 2011-08-09 ENCOUNTER — Encounter (HOSPITAL_BASED_OUTPATIENT_CLINIC_OR_DEPARTMENT_OTHER): Payer: Self-pay | Admitting: *Deleted

## 2011-08-09 ENCOUNTER — Emergency Department (HOSPITAL_BASED_OUTPATIENT_CLINIC_OR_DEPARTMENT_OTHER)
Admission: EM | Admit: 2011-08-09 | Discharge: 2011-08-09 | Disposition: A | Discharge status: Home / Self Care | Payer: BC Managed Care – PPO | Attending: Emergency Medicine | Admitting: Emergency Medicine

## 2011-08-09 DIAGNOSIS — I2510 Atherosclerotic heart disease of native coronary artery without angina pectoris: Secondary | ICD-10-CM | POA: Insufficient documentation

## 2011-08-09 DIAGNOSIS — M545 Low back pain, unspecified: Secondary | ICD-10-CM | POA: Insufficient documentation

## 2011-08-09 DIAGNOSIS — Z79899 Other long term (current) drug therapy: Secondary | ICD-10-CM | POA: Insufficient documentation

## 2011-08-09 HISTORY — DX: Atherosclerotic heart disease of native coronary artery without angina pectoris: I25.10

## 2011-08-09 LAB — URINALYSIS, ROUTINE W REFLEX MICROSCOPIC
Bilirubin Urine: NEGATIVE
Glucose, UA: NEGATIVE mg/dL
Hgb urine dipstick: NEGATIVE
Ketones, ur: NEGATIVE mg/dL
Leukocytes, UA: NEGATIVE
Nitrite: NEGATIVE
Protein, ur: NEGATIVE mg/dL
Specific Gravity, Urine: 1.025 (ref 1.005–1.030)
Urobilinogen, UA: 1 mg/dL (ref 0.0–1.0)
pH: 6.5 (ref 5.0–8.0)

## 2011-08-09 MED ORDER — PREDNISONE 10 MG PO TABS: ORAL_TABLET | ORAL | Status: DC

## 2011-08-09 MED ORDER — OXYCODONE-ACETAMINOPHEN 5-325 MG PO TABS: 2.0000 | ORAL_TABLET | ORAL | Status: AC | PRN

## 2011-08-09 NOTE — ED Notes (Signed)
Pt presents to ED today with low back pain for 1 week.  Pt denies any flank pain or UTI sx.  Pt describes as tingling radiating down towards knees.

## 2011-08-09 NOTE — ED Provider Notes (Signed)
History     CSN: [id number]  Arrival date & time 08/09/11  1728   First MD Initiated Contact with Patient 08/09/11 1934      Chief Complaint  Patient presents with  . Back Pain    (Consider location/radiation/quality/duration/timing/severity/associated sxs/prior treatment) Patient is a 36 y.o. female presenting with back pain. The history is provided by the patient. No language interpreter was used.  Back Pain  This is a new problem. The current episode started more than 1 week ago. The problem has not changed since onset.The pain is associated with no known injury. The pain is present in the lumbar spine. The quality of the pain is described as stabbing. The pain does not radiate. The pain is at a severity of 7/10. The pain is moderate. The symptoms are aggravated by certain positions, twisting and bending. The pain is the same all the time. Stiffness is present all day. Pertinent negatives include no chest pain, no abdominal pain, no paresthesias, no paresis and no weakness. She has tried nothing for the symptoms.  Pt complains of pain in low back.  Pt denies any leg pain.  No numbness  Past Medical History  Diagnosis Date  . Coronary artery disease     Past Surgical History  Procedure Date  . Abdominal hysterectomy   . Cardiac catheterization     History reviewed. No pertinent family history.  History  Substance Use Topics  . Smoking status: Never Smoker   . Smokeless tobacco: Not on file  . Alcohol Use: Yes    OB History    Grav Para Term Preterm Abortions TAB SAB Ect Mult Living                  Review of Systems  Cardiovascular: Negative for chest pain.  Gastrointestinal: Negative for abdominal pain.  Musculoskeletal: Positive for back pain.  Neurological: Negative for weakness and paresthesias.  All other systems reviewed and are negative.    Allergies  Review of patient's allergies indicates no known allergies.  Home Medications   Current Outpatient  Rx  Name Route Sig Dispense Refill  . AMLODIPINE BESYLATE 5 MG PO TABS Oral Take 5 mg by mouth daily.    Marland Kitchen METOPROLOL TARTRATE 25 MG PO TABS Oral Take 50 mg by mouth daily.    . SERTRALINE HCL 50 MG PO TABS Oral Take 50 mg by mouth daily.    . SULFAMETHOXAZOLE-TMP DS 800-160 MG PO TABS Oral Take 1 tablet by mouth 2 (two) times daily.      There were no vitals taken for this visit.  Physical Exam  Nursing note and vitals reviewed. Constitutional: She is oriented to person, place, and time. She appears well-developed and well-nourished.  HENT:  Head: Normocephalic.  Neck: Normal range of motion.  Cardiovascular: Normal rate, regular rhythm and normal heart sounds.   Pulmonary/Chest: Effort normal.  Abdominal: Soft. Bowel sounds are normal.  Musculoskeletal: She exhibits tenderness. She exhibits no edema.       Tender ls spine,  Decreased range of motion,    Neurological: She is alert and oriented to person, place, and time.  Skin: Skin is warm.  Psychiatric: She has a normal mood and affect.    ED Course  Procedures (including critical care time)   Labs Reviewed  URINALYSIS, ROUTINE W REFLEX MICROSCOPIC   No results found.   No diagnosis found.    MDM  Pt advised to see Dr. Ricki Miller for recheck in 3-4 days.  Pt given Rx for prednisone and percocet.        Langston Masker, [place] 08/09/11 2004

## 2011-08-09 NOTE — ED Notes (Signed)
Pt describes low back and abd pain off and on x 1 month. Denies vaginal discharge. + frequency. Denies other s/s.

## 2011-08-10 NOTE — ED Provider Notes (Signed)
Medical screening examination/treatment/procedure(s) were performed by non-physician practitioner and as supervising physician I was immediately available for consultation/collaboration.    Zair Borawski L Daryll Spisak, MD 08/10/11 1317 

## 2013-08-23 ENCOUNTER — Encounter (HOSPITAL_BASED_OUTPATIENT_CLINIC_OR_DEPARTMENT_OTHER): Payer: Self-pay | Admitting: Emergency Medicine

## 2013-08-23 ENCOUNTER — Emergency Department (HOSPITAL_BASED_OUTPATIENT_CLINIC_OR_DEPARTMENT_OTHER)
Admission: EM | Admit: 2013-08-23 | Discharge: 2013-08-23 | Disposition: A | Discharge status: Home / Self Care | Payer: Managed Care, Other (non HMO) | Attending: Emergency Medicine | Admitting: Emergency Medicine

## 2013-08-23 ENCOUNTER — Emergency Department (HOSPITAL_BASED_OUTPATIENT_CLINIC_OR_DEPARTMENT_OTHER): Payer: Managed Care, Other (non HMO)

## 2013-08-23 DIAGNOSIS — I10 Essential (primary) hypertension: Secondary | ICD-10-CM

## 2013-08-23 DIAGNOSIS — Z9889 Other specified postprocedural states: Secondary | ICD-10-CM | POA: Insufficient documentation

## 2013-08-23 DIAGNOSIS — I2510 Atherosclerotic heart disease of native coronary artery without angina pectoris: Secondary | ICD-10-CM | POA: Insufficient documentation

## 2013-08-23 DIAGNOSIS — IMO0002 Reserved for concepts with insufficient information to code with codable children: Secondary | ICD-10-CM | POA: Insufficient documentation

## 2013-08-23 DIAGNOSIS — Z79899 Other long term (current) drug therapy: Secondary | ICD-10-CM | POA: Insufficient documentation

## 2013-08-23 LAB — BASIC METABOLIC PANEL
BUN: 15 mg/dL (ref 6–23)
CHLORIDE: 103 meq/L (ref 96–112)
CO2: 24 mEq/L (ref 19–32)
Calcium: 9.2 mg/dL (ref 8.4–10.5)
Creatinine, Ser: 0.9 mg/dL (ref 0.50–1.10)
GFR calc non Af Amer: 81 mL/min — ABNORMAL LOW (ref >90)
Glucose, Bld: 96 mg/dL (ref 70–99)
Potassium: 3.7 mEq/L (ref 3.7–5.3)
SODIUM: 140 meq/L (ref 137–147)

## 2013-08-23 LAB — CBC WITH DIFFERENTIAL/PLATELET
BASOS ABS: 0 10*3/uL (ref 0.0–0.1)
Basophils Relative: 0 % (ref 0–1)
Eosinophils Absolute: 0.1 10*3/uL (ref 0.0–0.7)
Eosinophils Relative: 2 % (ref 0–5)
HCT: 37 % (ref 36.0–46.0)
Hemoglobin: 12.4 g/dL (ref 12.0–15.0)
Lymphocytes Relative: 44 % (ref 12–46)
Lymphs Abs: 3.1 10*3/uL (ref 0.7–4.0)
MCH: 30.9 pg (ref 26.0–34.0)
MCHC: 33.5 g/dL (ref 30.0–36.0)
MCV: 92.3 fL (ref 78.0–100.0)
MONO ABS: 0.5 10*3/uL (ref 0.1–1.0)
Monocytes Relative: 7 % (ref 3–12)
NEUTROS ABS: 3.3 10*3/uL (ref 1.7–7.7)
Neutrophils Relative %: 47 % (ref 43–77)
PLATELETS: 254 10*3/uL (ref 150–400)
RBC: 4.01 MIL/uL (ref 3.87–5.11)
RDW: 11.8 % (ref 11.5–15.5)
WBC: 7.1 10*3/uL (ref 4.0–10.5)

## 2013-08-23 LAB — TROPONIN I

## 2013-08-23 NOTE — ED Provider Notes (Signed)
CSN: [id number]     Arrival date & time 08/23/13  1815 History   First MD Initiated Contact with Patient 08/23/13 1920     Chief Complaint  Patient presents with  . Hypertension   HPI Patient presents to emergency room with complaints of elevated blood pressure. She has been checking it at home and his been fluctuating significantly. She's also had trouble with occasional headache and dizziness. She's also had intermittent episodes of chest pain and has noticed swelling of her lower legs.  The dizziness seems to be a sensation of movement when she moves her head she has not any trouble with her balance or coordination. She has not had any trouble with her speech or weakness in her extremities. Patient's chest pain has been intermittent and not severe. Patient denies any shortness of breath. She's not had any vomiting or diarrhea.  Patient has no history of heart disease. She did have a normal cardiac catheterization many years ago. She has been taking her blood pressure medications and did have her dosages increased in the last few months. Past Medical History  Diagnosis Date  . Coronary artery disease    Past Surgical History  Procedure Laterality Date  . Abdominal hysterectomy    . Cardiac catheterization     History reviewed. No pertinent family history. History  Substance Use Topics  . Smoking status: Never Smoker   . Smokeless tobacco: Not on file  . Alcohol Use: Yes   OB History   Grav Para Term Preterm Abortions TAB SAB Ect Mult Living                 Review of Systems  All other systems reviewed and are negative.      Allergies  Review of patient's allergies indicates no known allergies.  Home Medications   Current Outpatient Rx  Name  Route  Sig  Dispense  Refill  . amLODipine (NORVASC) 5 MG tablet   Oral   Take 10 mg by mouth daily.          . metoprolol tartrate (LOPRESSOR) 25 MG tablet   Oral   Take 50 mg by mouth daily.         . predniSONE  (DELTASONE) 10 MG tablet      6,5,4,3,2,1 taper   21 tablet   0   . sertraline (ZOLOFT) 50 MG tablet   Oral   Take 50 mg by mouth daily.         Marland Kitchen. sulfamethoxazole-trimethoprim (BACTRIM DS) 800-160 MG per tablet   Oral   Take 1 tablet by mouth 2 (two) times daily.          BP 146/94  Pulse 71  Temp(Src) 98.8 F (37.1 C)  Resp 16  Ht 5\' 5"  (1.651 m)  Wt 200 lb (90.719 kg)  BMI 33.28 kg/m2  SpO2 100% Physical Exam  Nursing note and vitals reviewed. Constitutional: She is oriented to person, place, and time. She appears well-developed and well-nourished. No distress.  HENT:  Head: Normocephalic and atraumatic.  Right Ear: External ear normal.  Left Ear: External ear normal.  Mouth/Throat: Oropharynx is clear and moist.  Eyes: Conjunctivae are normal. Right eye exhibits no discharge. Left eye exhibits no discharge. No scleral icterus.  Neck: Neck supple. No tracheal deviation present.  Cardiovascular: Normal rate, regular rhythm and intact distal pulses.   Pulmonary/Chest: Effort normal and breath sounds normal. No stridor. No respiratory distress. She has no wheezes. She has no rales.  Abdominal: Soft. Bowel sounds are normal. She exhibits no distension. There is no tenderness. There is no rebound and no guarding.  Musculoskeletal: She exhibits no edema and no tenderness.  Neurological: She is alert and oriented to person, place, and time. She has normal strength. No cranial nerve deficit (no facial droop, extraocular movements intact, no slurred speech) or sensory deficit. She exhibits normal muscle tone. She displays no seizure activity. Coordination normal.  No pronator drift bilateral upper extrem, able to hold both legs off bed for 5 seconds, sensation intact in all extremities, no visual field cuts, no left or right sided neglect, normal finger-nose exam bilaterally, no nystagmus noted   Skin: Skin is warm and dry. No rash noted.  Psychiatric: She has a normal mood  and affect.    ED Course  Procedures (including critical care time) Labs Review Labs Reviewed  BASIC METABOLIC PANEL - Abnormal; Notable for the following:    GFR calc non Af Amer 81 (*)    All other components within normal limits  CBC WITH DIFFERENTIAL  TROPONIN I   Imaging Review Dg Chest 2 View  08/23/2013   CLINICAL DATA:  Left chest pain.  EXAM: CHEST  2 VIEW  COMPARISON:  02/03/2011.  FINDINGS: The heart remains normal in size and the lungs remain clear with normal vascularity. Minimal positional scoliosis.  IMPRESSION: No acute abnormality.   Electronically Signed   By: Gordan Payment M.D.   On: 08/23/2013 19:41    EKG Interpretation    Date/Time:  Tuesday August 23 2013 19:39:23 EST Ventricular Rate:  62 PR Interval:  130 QRS Duration: 80 QT Interval:  404 QTC Calculation: 410 R Axis:   24 Text Interpretation:  Normal sinus rhythm Normal ECG No significant change since last tracing Confirmed by Brande Uncapher  MD-J, Lizzeth Meder ([id number]) on 08/23/2013 7:42:56 PM           Cardiac cath, may 2012  FINAL DIAGNOSIS:  1. Normal coronary arteries.  2. Catheter tip induced spasm in the proximal right coronary artery  totally relieved with intracoronary nitroglycerin.  3. Normal left ventricular function.  4. Successful Angio-Seal of the right femoral artery  MDM   Final diagnoses:  HTN (hypertension)    No sign of cardiac disease or ischemia.   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  Will dc home.  Follow up with PCP.  Pt is reassured and ready to go home.  Regarding her dizziness.  She may be having some peripheral vertigo but it is very mild.  Doubt stroke.  Nl neuro exam.   Celene Kras, MD 08/23/13 2042

## 2013-08-23 NOTE — ED Notes (Addendum)
Pt c/o increased BP/HA with dizziness and right lower leg swelling x 1 week

## 2013-08-23 NOTE — Discharge Instructions (Signed)
Hypertension Hypertension is another name for high blood pressure. High blood pressure may mean that your heart needs to work harder to pump blood. Blood pressure consists of two numbers, which includes a higher number over a lower number (example: 110/72). HOME CARE   Make lifestyle changes as told by your doctor. This may include weight loss and exercise.  Take your blood pressure medicine every day.  Limit how much salt you use.  Stop smoking if you smoke.  Do not use drugs.  Talk to your doctor if you are using decongestants or birth control pills. These medicines might make blood pressure higher.  Females should not drink more than 1 alcoholic drink per day. Males should not drink more than 2 alcoholic drinks per day.  See your doctor as told. GET HELP RIGHT AWAY IF:   You have a blood pressure reading with a top number of 180 or higher.  You get a very bad headache.  You get blurred or changing vision.  You feel confused.  You feel weak, numb, or faint.  You get chest or belly (abdominal) pain.  You throw up (vomit).  You cannot breathe very well. MAKE SURE YOU:   Understand these instructions.  Will watch your condition.  Will get help right away if you are not doing well or get worse. Document Released: 12/03/2007 Document Revised: 09/08/2011 Document Reviewed: 12/03/2007 [employer] Patient Information 2014 [employer].  

## 2013-08-31 ENCOUNTER — Other Ambulatory Visit: Payer: Self-pay | Admitting: Internal Medicine

## 2013-08-31 DIAGNOSIS — N644 Mastodynia: Secondary | ICD-10-CM

## 2013-09-09 ENCOUNTER — Ambulatory Visit
Admission: RE | Admit: 2013-09-09 | Discharge: 2013-09-09 | Disposition: A | Discharge status: Home / Self Care | Payer: Managed Care, Other (non HMO) | Source: Ambulatory Visit | Attending: Internal Medicine | Admitting: Internal Medicine

## 2013-09-09 ENCOUNTER — Ambulatory Visit
Admission: RE | Admit: 2013-09-09 | Discharge: 2013-09-09 | Disposition: A | Discharge status: Home / Self Care | Payer: BC Managed Care – PPO | Source: Ambulatory Visit | Attending: Internal Medicine | Admitting: Internal Medicine

## 2013-09-09 DIAGNOSIS — N644 Mastodynia: Secondary | ICD-10-CM

## 2014-07-05 HISTORY — PX: BUNIONECTOMY: SHX129

## 2014-08-12 ENCOUNTER — Emergency Department (HOSPITAL_COMMUNITY): Payer: Managed Care, Other (non HMO)

## 2014-08-12 ENCOUNTER — Encounter (HOSPITAL_COMMUNITY): Payer: Self-pay | Admitting: Nurse Practitioner

## 2014-08-12 ENCOUNTER — Emergency Department (HOSPITAL_COMMUNITY)
Admission: EM | Admit: 2014-08-12 | Discharge: 2014-08-12 | Disposition: A | Discharge status: Home / Self Care | Payer: Managed Care, Other (non HMO) | Attending: Emergency Medicine | Admitting: Emergency Medicine

## 2014-08-12 DIAGNOSIS — Z9889 Other specified postprocedural states: Secondary | ICD-10-CM | POA: Insufficient documentation

## 2014-08-12 DIAGNOSIS — Z79899 Other long term (current) drug therapy: Secondary | ICD-10-CM | POA: Insufficient documentation

## 2014-08-12 DIAGNOSIS — M79672 Pain in left foot: Secondary | ICD-10-CM

## 2014-08-12 DIAGNOSIS — L97521 Non-pressure chronic ulcer of other part of left foot limited to breakdown of skin: Secondary | ICD-10-CM | POA: Diagnosis not present

## 2014-08-12 DIAGNOSIS — Z792 Long term (current) use of antibiotics: Secondary | ICD-10-CM | POA: Insufficient documentation

## 2014-08-12 DIAGNOSIS — I2510 Atherosclerotic heart disease of native coronary artery without angina pectoris: Secondary | ICD-10-CM | POA: Diagnosis not present

## 2014-08-12 DIAGNOSIS — Z7952 Long term (current) use of systemic steroids: Secondary | ICD-10-CM | POA: Insufficient documentation

## 2014-08-12 LAB — CBC WITH DIFFERENTIAL/PLATELET
BASOS PCT: 0 % (ref 0–1)
Basophils Absolute: 0 10*3/uL (ref 0.0–0.1)
EOS PCT: 3 % (ref 0–5)
Eosinophils Absolute: 0.2 10*3/uL (ref 0.0–0.7)
HEMATOCRIT: 36.5 % (ref 36.0–46.0)
HEMOGLOBIN: 12.3 g/dL (ref 12.0–15.0)
LYMPHS ABS: 1.9 10*3/uL (ref 0.7–4.0)
Lymphocytes Relative: 38 % (ref 12–46)
MCH: 29.8 pg (ref 26.0–34.0)
MCHC: 33.7 g/dL (ref 30.0–36.0)
MCV: 88.4 fL (ref 78.0–100.0)
MONO ABS: 0.5 10*3/uL (ref 0.1–1.0)
MONOS PCT: 10 % (ref 3–12)
Neutro Abs: 2.4 10*3/uL (ref 1.7–7.7)
Neutrophils Relative %: 49 % (ref 43–77)
Platelets: 237 10*3/uL (ref 150–400)
RBC: 4.13 MIL/uL (ref 3.87–5.11)
RDW: 11.9 % (ref 11.5–15.5)
WBC: 5 10*3/uL (ref 4.0–10.5)

## 2014-08-12 LAB — I-STAT CHEM 8, ED
BUN: 14 mg/dL (ref 6–23)
CHLORIDE: 105 mmol/L (ref 96–112)
CREATININE: 0.9 mg/dL (ref 0.50–1.10)
Calcium, Ion: 1.19 mmol/L (ref 1.12–1.23)
Glucose, Bld: 93 mg/dL (ref 70–99)
HCT: 39 % (ref 36.0–46.0)
Hemoglobin: 13.3 g/dL (ref 12.0–15.0)
Potassium: 3.8 mmol/L (ref 3.5–5.1)
Sodium: 140 mmol/L (ref 135–145)
TCO2: 21 mmol/L (ref 0–100)

## 2014-08-12 LAB — SEDIMENTATION RATE: Sed Rate: 14 mm/hr (ref 0–22)

## 2014-08-12 NOTE — Discharge Instructions (Signed)
Please follow up closely with your orthopedic specialist for further management of your left foot ulcer.  Take antibiotic as prescribed for the full duration.  Take ibuprofen or tylenol for pain.  Soak feet with warm water with dial antibacterial soap.  Return if your condition worsen or if you have other concerns.    Skin Ulcer A skin ulcer is an open sore that can be shallow or deep. Skin ulcers sometimes become infected and are difficult to treat. It may be 1 month or longer before real healing progress is made. CAUSES   Injury.  Problems with the veins or arteries.  Diabetes.  Insect bites.  Bedsores.  Inflammatory conditions. SYMPTOMS   Pain, redness, swelling, and tenderness around the ulcer.  Fever.  Bleeding from the ulcer.  Yellow or clear fluid coming from the ulcer. DIAGNOSIS  There are many types of skin ulcers. Any open sores will be examined. Certain tests will be done to determine the kind of ulcer you have. The right treatment depends on the type of ulcer you have. TREATMENT  Treatment is a long-term challenge. It may include:  Wearing an elastic wrap, compression stockings, or gel cast over the ulcer area.  Taking antibiotic medicines or putting antibiotic creams on the affected area if there is an infection. HOME CARE INSTRUCTIONS  Put on your bandages (dressings), wraps, or casts over the ulcer as directed by your caregiver.  Change all dressings as directed by your caregiver.  Take all medicines as directed by your caregiver.  Keep the affected area clean and dry.  Avoid injuries to the affected area.  Eat a well-balanced, healthy diet that includes plenty of fruit and vegetables.  If you smoke, consider quitting or decreasing the amount of cigarettes you smoke.  Once the ulcer heals, get regular exercise as directed by your caregiver.  Work with your caregiver to make sure your blood pressure, cholesterol, and diabetes are  well-controlled.  Keep your skin moisturized. Dry skin can crack and lead to skin ulcers. SEEK IMMEDIATE MEDICAL CARE IF:   Your pain gets worse.  You have swelling, redness, or fluids around the ulcer.  You have chills.  You have a fever. MAKE SURE YOU:   Understand these instructions.  Will watch your condition.  Will get help right away if you are not doing well or get worse. Document Released: 07/24/2004 Document Revised: 09/08/2011 Document Reviewed: 01/31/2011 [facility] Patient Information 2015 [place], [place]. This information is not intended to replace advice given to you by your health care provider. Make sure you discuss any questions you have with your health care provider.

## 2014-08-12 NOTE — ED Notes (Signed)
She was told to come to ED and ask for Dr hewitt with [facility] to be called. They did surgery on her foot and are now concerned about infection in the incision site

## 2014-08-12 NOTE — ED Provider Notes (Signed)
CSN: [id number]     Arrival date & time 08/12/14  1353 History   First MD Initiated Contact with Patient 08/12/14 1403     Chief Complaint  Patient presents with  . Wound Infection     (Consider location/radiation/quality/duration/timing/severity/associated sxs/prior Treatment) HPI   39 year old female presents for evaluation of possible foot infection. Patient states she had a bunionectomy to her left foot on January 6, performed by Dr. Myra RudeGioffrey.  She was discharged with Keflex. She mentioned that the wound was healing appropriately however for the past 3-4 days she noticed some swelling to the surgical site. 2 days ago a blister pop exposing some tissue, and she reported having increasing sharp throbbing pain to the affected area. She was seen by Dr. Tinnie GensJeffrey 2 days ago for further evaluation and was told to apply Neosporin, apply a Band-Aid, and take Keflex. Today after removing the Band-Aid she noticed worsening ulceration and pain and decided to come to the ER for further evaluation. Her pain is currently 6 out of 10. No associated fever, ankle pain, numbness, or rash. She is up-to-date with her tetanus. She does not want any pain medication at this time. She has been taking the Keflex as prescribed.  Past Medical History  Diagnosis Date  . Coronary artery disease    Past Surgical History  Procedure Laterality Date  . Abdominal hysterectomy    . Cardiac catheterization     History reviewed. No pertinent family history. History  Substance Use Topics  . Smoking status: Never Smoker   . Smokeless tobacco: Not on file  . Alcohol Use: Yes   OB History    No data available     Review of Systems  Constitutional: Negative for fever.  Skin: Positive for wound.      Allergies  Vicodin  Home Medications   Prior to Admission medications   Medication Sig Start Date End Date Taking? Authorizing Provider  amLODipine (NORVASC) 5 MG tablet Take 10 mg by mouth daily.     Historical  Provider, MD  metoprolol tartrate (LOPRESSOR) 25 MG tablet Take 50 mg by mouth daily.    Historical Provider, MD  predniSONE (DELTASONE) 10 MG tablet 6,5,4,3,2,1 taper 08/09/11   Elson AreasLeslie K Sofia, PA-C  sertraline (ZOLOFT) 50 MG tablet Take 50 mg by mouth daily.    Historical Provider, MD  sulfamethoxazole-trimethoprim (BACTRIM DS) 800-160 MG per tablet Take 1 tablet by mouth 2 (two) times daily.    Historical Provider, MD   BP 132/79 mmHg  Pulse 79  Temp(Src) 98 F (36.7 C) (Oral)  Resp 15  SpO2 100% Physical Exam  Constitutional: She appears well-developed and well-nourished. No distress.  HENT:  Head: Atraumatic.  Eyes: Conjunctivae are normal.  Neck: Neck supple.  Musculoskeletal: She exhibits tenderness (left foot: Site of bunionectomy to the first metatarsal with a quarter size skin ulceration with granular tissue overlying the surgical scar. Tenderness to palpation, no obvious abscess).  Neurological: She is alert.  Skin: No rash noted.  Psychiatric: She has a normal mood and affect.  Nursing note and vitals reviewed.   ED Course  Procedures (including critical care time)   L foot: Ulceration noted at the bunionectomy site with surrounding macerated skin and tenderness to palpation.  Normal granular tissue, no pustular discharge and no surrounding erythema.  Will obtain Xray to r/o osteo.  Pain medication offered, pt declined.  4:00 PM X-rays shows soft tissue swelling however no evidence of osteomyelitis. Patient is afebrile with stable normal  vital sign, labs are reassuring, no leukocytosis. Patient will continue taking Keflex as previously prescribed, appropriate wound care including soaking the foot with Dial antibacterial soap, dressing changes, and keep leg elevated as discussed with patient. Patient will follow up promptly with her orthopedic doctor for further care. Return precautions discussed.  Care discussed with Dr. Manus Gunning   Labs Review Labs Reviewed  CBC WITH  DIFFERENTIAL/PLATELET  SEDIMENTATION RATE  C-REACTIVE PROTEIN  I-STAT CHEM 8, ED    Imaging Review Dg Foot Complete Left  08/12/2014   CLINICAL DATA:  Recent left foot surgery open swelling/ pain. Initial encounter.  EXAM: LEFT FOOT - COMPLETE 3+ VIEW  COMPARISON:  None.  FINDINGS: Postsurgical changes of the distal first metatarsal identified. Overlying soft tissue swelling is nonspecific.  No acute fracture or radiographic evidence of osteomyelitis is identified.  No other abnormalities are identified.  IMPRESSION: Distal first metatarsal post operative changes with nonspecific soft tissue swelling. No radiographic evidence of osteomyelitis. If there is strong clinical suspicion for osteomyelitis, consider MR.   Electronically Signed   By: Harmon Pier M.D.   On: 08/12/2014 15:17     EKG Interpretation None      MDM   Final diagnoses:  Left foot pain  Foot ulceration, left, limited to breakdown of skin    BP 116/70 mmHg  Pulse 66  Temp(Src) 98 F (36.7 C) (Oral)  Resp 16  SpO2 100%  I have reviewed nursing notes and vital signs. I personally reviewed the imaging tests through PACS system  I reviewed available ER/hospitalization records thought the EMR     Fayrene Helper, PA-C 08/12/14 1602  Glynn Octave, MD 08/12/14 1620

## 2014-08-13 LAB — C-REACTIVE PROTEIN: CRP: <0.5 mg/dL — ABNORMAL LOW (ref <0.60)

## 2014-09-07 ENCOUNTER — Encounter: Payer: Self-pay | Admitting: Gastroenterology

## 2014-09-07 ENCOUNTER — Other Ambulatory Visit (HOSPITAL_COMMUNITY): Payer: Self-pay | Admitting: Internal Medicine

## 2014-09-07 DIAGNOSIS — R112 Nausea with vomiting, unspecified: Secondary | ICD-10-CM

## 2014-09-08 ENCOUNTER — Ambulatory Visit (HOSPITAL_COMMUNITY): Payer: Managed Care, Other (non HMO)

## 2014-09-08 ENCOUNTER — Ambulatory Visit (HOSPITAL_COMMUNITY)
Admission: RE | Admit: 2014-09-08 | Discharge: 2014-09-08 | Disposition: A | Discharge status: Home / Self Care | Payer: Managed Care, Other (non HMO) | Source: Ambulatory Visit | Attending: Gastroenterology | Admitting: Gastroenterology

## 2014-09-08 DIAGNOSIS — R112 Nausea with vomiting, unspecified: Secondary | ICD-10-CM | POA: Diagnosis present

## 2014-09-15 ENCOUNTER — Other Ambulatory Visit (INDEPENDENT_AMBULATORY_CARE_PROVIDER_SITE_OTHER): Payer: Managed Care, Other (non HMO)

## 2014-09-15 ENCOUNTER — Ambulatory Visit (INDEPENDENT_AMBULATORY_CARE_PROVIDER_SITE_OTHER): Payer: Managed Care, Other (non HMO) | Admitting: Physician Assistant

## 2014-09-15 ENCOUNTER — Encounter: Payer: Self-pay | Admitting: Physician Assistant

## 2014-09-15 VITALS — BP 134/70 | HR 60 | Ht 65.0 in | Wt 200.4 lb

## 2014-09-15 DIAGNOSIS — R1114 Bilious vomiting: Secondary | ICD-10-CM

## 2014-09-15 DIAGNOSIS — R197 Diarrhea, unspecified: Secondary | ICD-10-CM

## 2014-09-15 DIAGNOSIS — R1084 Generalized abdominal pain: Secondary | ICD-10-CM

## 2014-09-15 LAB — CBC WITH DIFFERENTIAL/PLATELET
BASOS ABS: 0 10*3/uL (ref 0.0–0.1)
Basophils Relative: 0.5 % (ref 0.0–3.0)
EOS ABS: 0.1 10*3/uL (ref 0.0–0.7)
EOS PCT: 3 % (ref 0.0–5.0)
HEMATOCRIT: 37.2 % (ref 36.0–46.0)
Hemoglobin: 12.6 g/dL (ref 12.0–15.0)
LYMPHS ABS: 1.8 10*3/uL (ref 0.7–4.0)
Lymphocytes Relative: 39.2 % (ref 12.0–46.0)
MCHC: 33.8 g/dL (ref 30.0–36.0)
MCV: 89.7 fl (ref 78.0–100.0)
Monocytes Absolute: 0.4 10*3/uL (ref 0.1–1.0)
Monocytes Relative: 8.1 % (ref 3.0–12.0)
NEUTROS PCT: 49.2 % (ref 43.0–77.0)
Neutro Abs: 2.3 10*3/uL (ref 1.4–7.7)
Platelets: 260 10*3/uL (ref 150.0–400.0)
RBC: 4.15 Mil/uL (ref 3.87–5.11)
RDW: 12.5 % (ref 11.5–15.5)
WBC: 4.6 10*3/uL (ref 4.0–10.5)

## 2014-09-15 LAB — C-REACTIVE PROTEIN: CRP: 0.1 mg/dL — AB (ref 0.5–20.0)

## 2014-09-15 LAB — COMPREHENSIVE METABOLIC PANEL
ALT: 10 U/L (ref 0–35)
AST: 14 U/L (ref 0–37)
Albumin: 4.3 g/dL (ref 3.5–5.2)
Alkaline Phosphatase: 58 U/L (ref 39–117)
BUN: 12 mg/dL (ref 6–23)
CALCIUM: 9.2 mg/dL (ref 8.4–10.5)
CO2: 30 meq/L (ref 19–32)
Chloride: 106 mEq/L (ref 96–112)
Creatinine, Ser: 0.9 mg/dL (ref 0.40–1.20)
GFR: 89.62 mL/min (ref >60.00)
GLUCOSE: 90 mg/dL (ref 70–99)
Potassium: 4.3 mEq/L (ref 3.5–5.1)
SODIUM: 139 meq/L (ref 135–145)
TOTAL PROTEIN: 6.9 g/dL (ref 6.0–8.3)
Total Bilirubin: 0.9 mg/dL (ref 0.2–1.2)

## 2014-09-15 LAB — IGA: IgA: 169 mg/dL (ref 68–378)

## 2014-09-15 LAB — LIPASE: Lipase: 13 U/L (ref 11.0–59.0)

## 2014-09-15 LAB — TSH: TSH: 1.06 u[IU]/mL (ref 0.35–4.50)

## 2014-09-15 LAB — AMYLASE: Amylase: 46 U/L (ref 27–131)

## 2014-09-15 MED ORDER — PANTOPRAZOLE SODIUM 40 MG PO TBEC: 40.0000 mg | DELAYED_RELEASE_TABLET | Freq: Every day | ORAL | Status: DC

## 2014-09-15 MED ORDER — DICYCLOMINE HCL 10 MG PO CAPS: 10.0000 mg | ORAL_CAPSULE | Freq: Three times a day (TID) | ORAL | Status: DC | PRN

## 2014-09-15 MED ORDER — NA SULFATE-K SULFATE-MG SULF 17.5-3.13-1.6 GM/177ML PO SOLN: 1.0000 | Freq: Every day | ORAL | Status: DC

## 2014-09-15 NOTE — Progress Notes (Signed)
Patient ID: Michele Frazier, female   DOB: Sep 10, 1975, 39 y.o.   MRN: [id number]    HPI:  Michele Frazier is a 39 y.o.   female is referred by Juline PatchPang, Richard, MD for evaluation of nausea, vomiting, diarrhea, and abdominal pain of 3 months duration.  Michele Frazier says that she had a bunionectomy on January 6 she was discharged home on 10 days of Keflex. She states the wound closed, but shortly thereafter she developed swelling of the wound and it "popped open" she was seen in the emergency room and x-rays were negative for osteomyelitis. She was started on another course of Keflex. While on the cath lax, she began to experience diarrhea with diffuse lower abdominal pain, nausea and vomiting. Initially, she went 6 days without a bowel movement but was very nauseous and vomited. She then began to have diarrhea and states she has approximately 4 mushy to watery bowel movements daily. She has been very bloated. She has nocturnal stooling. She has had no blood with her stools but has had mucus with her stools. She denies tenesmus or rectal pain. She reports that she gets nauseous after she eats and then develops cramping and has to have diarrhea. She has had rare vomiting over the past several weeks. Her stools are not oily. She has lost 6 or 7 pounds in the past 2 weeks.  She has been experiencing abdominal pain and cramping in the P umbilical area and to the right of the periumbilical area. She has been having low back pain and says she can't get comfortable. She denies NSAID use. She has no GU symptoms. She had a hysterectomy years ago. Prior to the onset of her diarrhea she had had several courses of Keflex as stated above. She had not traveled out of the country nor had she acquired any new pets. She has city water. Through this time, her daughter has had several bouts of bloody diarrhea. Michele Frazier is had some intermittent night sweats. She was evaluated by her primary care provider and sent for an abdominal  ultrasound which was unrevealing. She was given a trial of Nexium which provided no relief and so she was advised to discontinue the Nexium and a prescription for pantoprazole was sent in for the patient but she has not yet picked it up.  Further questioning reveals that the patient has a history of ulcers. She says she had an [facility] years ago but does not remember the endoscopist who performed it. She states she was H. pylori positive and treated with antibiotics but never really felt better. Review of computerized chart shows that she had an EGD on 07/16/2006 by Dr. or. She has never had a colonoscopy. She states she has a 39 year old daughter who was diagnosed with "colitis" 2 years ago. She says when her daughter was little she was followed [facility] and told she was lactose intolerance. She is not aware of a family history of colon cancer or colon polyps, and she is not aware of a family history of celiac disease.   Past Medical History  Diagnosis Date  . Coronary artery disease   . Arthritis   . Depression   . Hypertension   . Pneumonia     Past Surgical History  Procedure Laterality Date  . Abdominal hysterectomy    . Cardiac catheterization    . Bunionectomy  07/05/2014   Family History  Problem Relation Age of Onset  . Cancer Maternal Grandmother  Pt did not know what type  . Cancer Maternal Grandfather     Pt did not know what type  . Colon cancer Neg Hx   . Colon polyps Neg Hx   . Diabetes Mother   . Heart disease Mother   . Kidney disease Paternal Uncle     x2  . Esophageal cancer Neg Hx   . Gallbladder disease Neg Hx    History  Substance Use Topics  . Smoking status: Never Smoker   . Smokeless tobacco: Never Used  . Alcohol Use: 0.0 oz/week    0 Standard drinks or equivalent per week     Comment: Occassoinally   Current Outpatient Prescriptions  Medication Sig Dispense Refill  . amLODipine (NORVASC) 10 MG tablet Take 10 mg by mouth daily.     . metoprolol succinate (TOPROL-XL) 50 MG 24 hr tablet Take 50 mg by mouth daily.    . sertraline (ZOLOFT) 50 MG tablet Take 50 mg by mouth daily.    Marland Kitchen telmisartan (MICARDIS) 40 MG tablet Take 40 mg by mouth daily.    Marland Kitchen dicyclomine (BENTYL) 10 MG capsule Take 1 capsule (10 mg total) by mouth 3 (three) times daily as needed for spasms. 60 capsule 2  . Na Sulfate-K Sulfate-Mg Sulf SOLN Take 1 Package by mouth daily. 1 Bottle 0  . pantoprazole (PROTONIX) 40 MG tablet Take 1 tablet (40 mg total) by mouth daily. 30 min prior to Breakfast 30 tablet 3   No current facility-administered medications for this visit.   Allergies  Allergen Reactions  . Latex Hives  . Vicodin [Hydrocodone-Acetaminophen] Nausea Only     Review of Systems: Gen: Denies any fever, chills, anorexia, fatigue, weakness, malaise, weight loss, and sleep disorder CV: Denies chest pain, angina, palpitations, syncope, orthopnea, PND, peripheral edema, and claudication. Resp: Denies dyspnea at rest, dyspnea with exercise, cough, sputum, wheezing, coughing up blood, and pleurisy. GI: Denies vomiting blood, jaundice, and fecal incontinence.   Denies dysphagia or odynophagia. GU : Denies urinary burning, blood in urine, urinary frequency, urinary hesitancy, nocturnal urination, and urinary incontinence. MS: Denies joint pain, limitation of movement, and swelling, stiffness, low back pain, extremity pain. Denies muscle weakness, cramps, atrophy.  Derm: Denies rash, itching, dry skin, hives, moles, warts, or unhealing ulcers.  Psych: Denies depression, anxiety, memory loss, suicidal ideation, hallucinations, paranoia, and confusion. Heme: Denies bruising, bleeding, and enlarged lymph nodes. Neuro:  Denies any headaches, dizziness, paresthesias. Endo:  Denies any problems with DM, thyroid, adrenal function  Studies: US Abdomen Complete  09/08/2014   CLINICAL DATA:  Nausea and vomiting.  EXAM: ULTRASOUND ABDOMEN COMPLETE   COMPARISON:  CT 07/22/2006.  FINDINGS: Gallbladder: No gallstones or wall thickening visualized. No sonographic Murphy sign noted.  Common bile duct: Diameter: 3.3 mm  Liver: No focal lesion identified. Within normal limits in parenchymal echogenicity.  IVC: No abnormality visualized.  Pancreas: Visualized portion unremarkable.  Spleen: Size and appearance within normal limits.  Right Kidney: Length: 9.8 cm. Echogenicity within normal limits. No mass or hydronephrosis visualized.  Left Kidney: Length: 9.6 cm. Echogenicity within normal limits. No mass or hydronephrosis visualized.  Abdominal aorta: No aneurysm visualized.  Other findings: None.  IMPRESSION: Normal exam.   Electronically Signed   By: Maisie Fus  Register   On: 09/08/2014 09:11      Prior Endoscopies:   EGD 07/16/2006, notes pending patient has signed a medical release.  Physical Exam: BP 134/70 mmHg  Pulse 60  Ht  (1.651 m)  Wt 200 lb 6 oz (90.89 kg)  BMI 33.34 kg/m2 Constitutional: Pleasant,well-developedfemale in no acute distress. HEENT: Normocephalic and atraumatic. Conjunctivae are normal. No scleral icterus. Neck supple.  Cardiovascular: Normal rate, regular rhythm.  Pulmonary/chest: Effort normal and breath sounds normal. No wheezing, rales or rhonchi. Abdominal: Soft, nondistended, tender to palpation in periumbilical area and RLQ,no rebound or guarding, Bowel sounds active throughout. There are no masses palpable. No hepatomegaly. Rectal: deferred per pt request Extremities: no edema Lymphadenopathy: No cervical adenopathy noted. Neurological: Alert and oriented to person place and time. Skin: Skin is warm and dry. No rashes noted. Psychiatric: Normal mood and affect. Behavior is normal.  ASSESSMENT AND PLAN: 39 year old female with a three-month history of diarrhea, nausea, and lower abdominal pain referred for evaluation. Her symptoms may be due to C. difficile, an inflammatory bowel process, or IBS. Stool for  occult blood, GI pathogen panel, stool Giardia antigen, and pancreatic fecal elastase will be obtained. A CBC, comprehensive metabolic panel, amylase, lipase, TSH, CRP, IgA, and TTG will be obtained. Patient has signed a medical release to obtain prior EGD records. She will be scheduled for a colonoscopy to evaluate for polyps, neoplasia, inflammatory bowel disease, microscopic colitis etc., along with an EGD to evaluate for esophagitis, gastritis, or ulcers.The risks, benefits, and alternatives to colonoscopy with possible biopsy and possible polypectomy were discussed with the patient and they consent to proceed. The risks, benefits, and alternatives to endoscopy with possible biopsy and possible dilation were discussed with the patient and they consent to proceed. In the meantime, she has been instructed to use pantoprazole 40 mg 1 by mouth every morning 30 minutes prior to breakfast. She will also be given a trial of dicyclomine 10 mg one by mouth 3 times a day when necessary cramping. Her colonoscopy and upper endoscopy will be scheduled with Dr. Arlyce Dice. Further recommendations will be made pending the findings of the above testing.    Azai Gaffin, Tollie Pizza PA-C 09/15/2014, 2:26 PM  CC: Juline Patch, MD

## 2014-09-15 NOTE — Progress Notes (Signed)
Reviewed and agree with management. Robert D. Kaplan, M.D., FACG  

## 2014-09-15 NOTE — Progress Notes (Signed)
Can tentatively schedule colonoscopy pending results of stool pathogen panel

## 2014-09-15 NOTE — Patient Instructions (Addendum)
Your physician has requested that you go to the basement for lab work before leaving today  You were sent home with hemoccult cards x3, Please follow instruction per package   You have been scheduled for an endoscopy and colonoscopy. Please follow the written instructions given to you at your visit today. Please pick up your prep supplies at the pharmacy within the next 1-3 days. If you use inhalers (even only as needed), please bring them with you on the day of your procedure. Your physician has requested that you go to www.startemmi.com and enter the access code given to you at your visit today. This web site gives a general overview about your procedure. However, you should still follow specific instructions given to you by our office regarding your preparation for the procedure.  We have sent medications to your pharmacy for you to pick up at your convenience

## 2014-09-18 LAB — TISSUE TRANSGLUTAMINASE, IGA: TISSUE TRANSGLUTAMINASE AB, IGA: 1 U/mL (ref <4)

## 2014-09-18 NOTE — Progress Notes (Signed)
Spoke to pt regarding the Hemoccult cards. She states that she is going to try to drop them off by mid week. Explained the importance on retuning them. Pt verbalized understanding.

## 2014-09-19 ENCOUNTER — Other Ambulatory Visit: Payer: Managed Care, Other (non HMO)

## 2014-09-19 DIAGNOSIS — R1084 Generalized abdominal pain: Secondary | ICD-10-CM

## 2014-09-19 DIAGNOSIS — R1114 Bilious vomiting: Secondary | ICD-10-CM

## 2014-09-19 DIAGNOSIS — R197 Diarrhea, unspecified: Secondary | ICD-10-CM

## 2014-09-20 LAB — GIARDIA ANTIGEN: Giardia Screen (EIA): NEGATIVE

## 2014-09-25 LAB — PANCREATIC ELASTASE, FECAL: Pancreatic Elastase-1, Stool: 438 mcg/g

## 2014-09-26 LAB — GASTROINTESTINAL PATHOGEN PANEL PCR
C. difficile Tox A/B, PCR: NEGATIVE
Campylobacter, PCR: NEGATIVE
Cryptosporidium, PCR: NEGATIVE
E COLI 0157, PCR: NEGATIVE
E coli (ETEC) LT/ST PCR: NEGATIVE
E coli (STEC) stx1/stx2, PCR: NEGATIVE
Giardia lamblia, PCR: NEGATIVE
Norovirus, PCR: NEGATIVE
Rotavirus A, PCR: NEGATIVE
SALMONELLA, PCR: NEGATIVE
Shigella, PCR: NEGATIVE

## 2014-10-03 ENCOUNTER — Encounter: Payer: Self-pay | Admitting: Gastroenterology

## 2014-10-10 ENCOUNTER — Ambulatory Visit (AMBULATORY_SURGERY_CENTER): Payer: Managed Care, Other (non HMO) | Admitting: Gastroenterology

## 2014-10-10 ENCOUNTER — Encounter: Payer: Self-pay | Admitting: Gastroenterology

## 2014-10-10 VITALS — BP 130/57 | HR 60 | Temp 97.2°F | Resp 14 | Ht 65.0 in | Wt 200.0 lb

## 2014-10-10 DIAGNOSIS — R1013 Epigastric pain: Secondary | ICD-10-CM

## 2014-10-10 DIAGNOSIS — R197 Diarrhea, unspecified: Secondary | ICD-10-CM | POA: Diagnosis present

## 2014-10-10 MED ORDER — SODIUM CHLORIDE 0.9 % IV SOLN: 500.0000 mL | INTRAVENOUS | Status: DC

## 2014-10-10 MED ORDER — HYOSCYAMINE SULFATE ER 0.375 MG PO TBCR: EXTENDED_RELEASE_TABLET | ORAL | Status: DC

## 2014-10-10 NOTE — Progress Notes (Signed)
Called to room to assist during endoscopic procedure.  Patient ID and intended procedure confirmed with present staff. Received instructions for my participation in the procedure from the performing physician.  

## 2014-10-10 NOTE — Patient Instructions (Addendum)

## 2014-10-10 NOTE — Op Note (Signed)
[facility] [address] [place], [id number]   ENDOSCOPY PROCEDURE REPORT  PATIENT: Michele Frazier, Michele Frazier  MR#: [id number] BIRTHDATE: [birth year] , 39  yrs. old GENDER: female ENDOSCOPIST: Louis Meckelobert D Gloria Lambertson, MD REFERRED BY:  Juline Patchichard Pang, M.D. PROCEDURE DATE:  10/10/2014 PROCEDURE:  EGD, diagnostic ASA CLASS:     Class II INDICATIONS:  epigastric pain. MEDICATIONS: Residual sedation present, Monitored anesthesia care, and Propofol 100 mg IV TOPICAL ANESTHETIC:  DESCRIPTION OF PROCEDURE: After the risks benefits and alternatives of the procedure were thoroughly explained, informed consent was obtained.  The LB [id number] endoscope was introduced through the mouth and advanced to the second portion of the duodenum , Without limitations.  The instrument was slowly withdrawn as the mucosa was fully examined.      EXAM: The esophagus and gastroesophageal junction were completely normal in appearance.  The stomach was entered and closely examined.The antrum, angularis, and lesser curvature were well visualized, including a retroflexed view of the cardia and fundus. The stomach wall was normally distensable.  The scope passed easily through the pylorus into the duodenum.  Retroflexed views revealed no abnormalities.     The scope was then withdrawn from the patient and the procedure completed.  COMPLICATIONS: There were no immediate complications.  ENDOSCOPIC IMPRESSION: Normal appearing esophagus and GE junction, the stomach was well visualized and normal in appearance, normal appearing duodenum  RECOMMENDATIONS: Hyomax as needed for abdominal pain Discontinue protonix  REPEAT EXAM:  eSigned:  Louis Meckelobert D Ohn Bostic, MD 10/10/2014 3:51 PM    CC:

## 2014-10-10 NOTE — Progress Notes (Signed)
Pt awake and alert, vss, pleased with MAC, Report to RN 

## 2014-10-10 NOTE — Op Note (Signed)
[facility] [address] [place], [id number]   COLONOSCOPY PROCEDURE REPORT  PATIENT: Michele Frazier, Michele Frazier  MR#: [id number] BIRTHDATE: 1976-01-04 , 39  yrs. old GENDER: female ENDOSCOPIST: Louis Meckelobert D Dhillon [place], MD REFERRED OZ:[id number]:Richard Ricki MillerPang, M.D. PROCEDURE DATE:  10/10/2014 PROCEDURE:   Colonoscopy with biopsy and Colonoscopy, diagnostic First Screening Colonoscopy - Avg.  risk and is 50 yrs.  old or older Yes.  Prior Negative Screening - Now for repeat screening. N/A  History of Adenoma - Now for follow-up colonoscopy & has been > or = to 3 yrs.  N/A ASA CLASS:   Class II INDICATIONS:Clinically significant diarrhea of unexplained origin.  MEDICATIONS: Monitored anesthesia care and Propofol 250 mg IV  DESCRIPTION OF PROCEDURE:   After the risks benefits and alternatives of the procedure were thoroughly explained, informed consent was obtained.  The digital rectal exam revealed no abnormalities of the rectum.   The LB [id number]  endoscope was introduced through the anus and advanced to the ileum. No adverse events experienced.   The quality of the prep was (Suprep was used) excellent.  The instrument was then slowly withdrawn as the colon was fully examined.      COLON FINDINGS: The examined terminal ileum appeared to be normal. A normal appearing cecum, ileocecal valve, and appendiceal orifice were identified.  The ascending, transverse, descending, sigmoid colon, and rectum appeared unremarkable.  Multiple biopsies were performed.  Retroflexed views revealed no abnormalities. The time to cecum = 2.9 Withdrawal time = 6.3   The scope was withdrawn and the procedure completed. COMPLICATIONS: There were no immediate complications.  ENDOSCOPIC IMPRESSION: 1.   The examined terminal ileum appeared to be normal 2.   Normal colonoscopy; multiple random biopsies were performed  RECOMMENDATIONS: Begin flora Q 1 tab daily Await biopsy results OV 3  weeks  eSigned:  Louis Meckelobert D Jenavee Laguardia, MD 10/10/2014 3:49 PM   cc:   PATIENT NAME:  Michele Frazier, Michele Frazier MR#: [id number]

## 2014-10-11 ENCOUNTER — Other Ambulatory Visit: Payer: Self-pay

## 2014-10-11 ENCOUNTER — Telehealth: Payer: Self-pay | Admitting: *Deleted

## 2014-10-11 NOTE — Telephone Encounter (Signed)
No answer, left message to call If questions or concerns. 

## 2014-10-19 NOTE — Progress Notes (Signed)
Quick Note:  Please inform the patient that colon Biopsies were normal . He needs a return OV ______

## 2014-11-01 ENCOUNTER — Ambulatory Visit: Payer: Managed Care, Other (non HMO) | Admitting: Gastroenterology

## 2015-01-08 ENCOUNTER — Ambulatory Visit: Payer: Managed Care, Other (non HMO) | Admitting: Gastroenterology

## 2015-04-30 ENCOUNTER — Emergency Department (HOSPITAL_BASED_OUTPATIENT_CLINIC_OR_DEPARTMENT_OTHER): Payer: Managed Care, Other (non HMO)

## 2015-04-30 ENCOUNTER — Emergency Department (HOSPITAL_BASED_OUTPATIENT_CLINIC_OR_DEPARTMENT_OTHER)
Admission: EM | Admit: 2015-04-30 | Discharge: 2015-04-30 | Disposition: A | Discharge status: Home / Self Care | Payer: Managed Care, Other (non HMO) | Attending: Emergency Medicine | Admitting: Emergency Medicine

## 2015-04-30 ENCOUNTER — Encounter (HOSPITAL_BASED_OUTPATIENT_CLINIC_OR_DEPARTMENT_OTHER): Payer: Self-pay

## 2015-04-30 DIAGNOSIS — F329 Major depressive disorder, single episode, unspecified: Secondary | ICD-10-CM | POA: Insufficient documentation

## 2015-04-30 DIAGNOSIS — I10 Essential (primary) hypertension: Secondary | ICD-10-CM | POA: Diagnosis not present

## 2015-04-30 DIAGNOSIS — J209 Acute bronchitis, unspecified: Secondary | ICD-10-CM | POA: Diagnosis not present

## 2015-04-30 DIAGNOSIS — Z9889 Other specified postprocedural states: Secondary | ICD-10-CM | POA: Insufficient documentation

## 2015-04-30 DIAGNOSIS — Z8701 Personal history of pneumonia (recurrent): Secondary | ICD-10-CM | POA: Diagnosis not present

## 2015-04-30 DIAGNOSIS — Z91040 Latex allergy status: Secondary | ICD-10-CM | POA: Insufficient documentation

## 2015-04-30 DIAGNOSIS — I2510 Atherosclerotic heart disease of native coronary artery without angina pectoris: Secondary | ICD-10-CM | POA: Insufficient documentation

## 2015-04-30 DIAGNOSIS — M1990 Unspecified osteoarthritis, unspecified site: Secondary | ICD-10-CM | POA: Diagnosis not present

## 2015-04-30 DIAGNOSIS — Z79899 Other long term (current) drug therapy: Secondary | ICD-10-CM | POA: Insufficient documentation

## 2015-04-30 DIAGNOSIS — J40 Bronchitis, not specified as acute or chronic: Secondary | ICD-10-CM

## 2015-04-30 DIAGNOSIS — R05 Cough: Secondary | ICD-10-CM | POA: Diagnosis present

## 2015-04-30 LAB — CBC WITH DIFFERENTIAL/PLATELET
BASOS ABS: 0 10*3/uL (ref 0.0–0.1)
Basophils Relative: 1 %
EOS PCT: 3 %
Eosinophils Absolute: 0.2 10*3/uL (ref 0.0–0.7)
HCT: 38.1 % (ref 36.0–46.0)
Hemoglobin: 12.8 g/dL (ref 12.0–15.0)
LYMPHS PCT: 45 %
Lymphs Abs: 2.4 10*3/uL (ref 0.7–4.0)
MCH: 30.7 pg (ref 26.0–34.0)
MCHC: 33.6 g/dL (ref 30.0–36.0)
MCV: 91.4 fL (ref 78.0–100.0)
Monocytes Absolute: 0.5 10*3/uL (ref 0.1–1.0)
Monocytes Relative: 9 %
NEUTROS ABS: 2.2 10*3/uL (ref 1.7–7.7)
Neutrophils Relative %: 42 %
PLATELETS: 244 10*3/uL (ref 150–400)
RBC: 4.17 MIL/uL (ref 3.87–5.11)
RDW: 11.7 % (ref 11.5–15.5)
WBC: 5.3 10*3/uL (ref 4.0–10.5)

## 2015-04-30 LAB — BASIC METABOLIC PANEL
ANION GAP: 5 (ref 5–15)
BUN: 15 mg/dL (ref 6–20)
CO2: 25 mmol/L (ref 22–32)
Calcium: 9.2 mg/dL (ref 8.9–10.3)
Chloride: 108 mmol/L (ref 101–111)
Creatinine, Ser: 0.79 mg/dL (ref 0.44–1.00)
GLUCOSE: 72 mg/dL (ref 65–99)
POTASSIUM: 3.8 mmol/L (ref 3.5–5.1)
Sodium: 138 mmol/L (ref 135–145)

## 2015-04-30 MED ORDER — BENZONATATE 100 MG PO CAPS: 100.0000 mg | ORAL_CAPSULE | Freq: Three times a day (TID) | ORAL | Status: DC

## 2015-04-30 MED ORDER — GUAIFENESIN 200 MG PO TABS: 200.0000 mg | ORAL_TABLET | ORAL | Status: DC | PRN

## 2015-04-30 MED ORDER — ALBUTEROL SULFATE HFA 108 (90 BASE) MCG/ACT IN AERS
2.0000 | INHALATION_SPRAY | Freq: Once | RESPIRATORY_TRACT | Status: AC
  Administered 2015-04-30: 2 via RESPIRATORY_TRACT
  Filled 2015-04-30: qty 6.7

## 2015-04-30 NOTE — ED Provider Notes (Signed)
CSN: [id number]     Arrival date & time 04/30/15  1513 History   First MD Initiated Contact with Patient 04/30/15 1521     Chief Complaint  Patient presents with  . Cough     HPI   Michele Frazier is a 39 y.o. female with a PMH of HTN, CAD who presents to the ED with cough productive of yellow sputum x 2 weeks. She reports associated nasal congestion and generalized fatigue. She denies exacerbating factors. She has not tried anything for symptom relief. She denies fever, chills, headache, lightheadedness, dizziness, chest pain, shortness of breath, abdominal pain, nausea, vomiting, diarrhea, constipation, numbness, weakness, paresthesia.   Past Medical History  Diagnosis Date  . Coronary artery disease   . Arthritis   . Depression   . Hypertension   . Pneumonia    Past Surgical History  Procedure Laterality Date  . Abdominal hysterectomy    . Cardiac catheterization    . Bunionectomy  07/05/2014   Family History  Problem Relation Age of Onset  . Cancer Maternal Grandmother     Pt did not know what type  . Cancer Maternal Grandfather     Pt did not know what type  . Colon cancer Neg Hx   . Colon polyps Neg Hx   . Diabetes Mother   . Heart disease Mother   . Kidney disease Paternal Uncle     x2  . Esophageal cancer Neg Hx   . Gallbladder disease Neg Hx    Social History  Substance Use Topics  . Smoking status: Never Smoker   . Smokeless tobacco: Never Used  . Alcohol Use: 0.0 oz/week    0 Standard drinks or equivalent per week     Comment: Occassoinally   OB History    No data available      Review of Systems  Constitutional: Positive for fatigue. Negative for fever and chills.  HENT: Positive for congestion.   Respiratory: Positive for cough. Negative for shortness of breath.   Cardiovascular: Negative for chest pain.  Gastrointestinal: Negative for nausea, vomiting, abdominal pain, diarrhea and constipation.  Neurological: Negative for dizziness, weakness,  light-headedness, numbness and headaches.  All other systems reviewed and are negative.     Allergies  Latex and Vicodin  Home Medications   Prior to Admission medications   Medication Sig Start Date End Date Taking? Authorizing Provider  amLODipine (NORVASC) 10 MG tablet Take 10 mg by mouth daily. 08/04/14   Historical Provider, MD  benzonatate (TESSALON) 100 MG capsule Take 1 capsule (100 mg total) by mouth every 8 (eight) hours. 04/30/15   Mady Gemma, PA-C  guaiFENesin 200 MG tablet Take 1 tablet (200 mg total) by mouth every 4 (four) hours as needed for cough or to loosen phlegm. 04/30/15   Mady Gemma, PA-C  Hyoscyamine Sulfate 0.375 MG TBCR Take one tab twice a day for 5 days, then as needed, abdominal pain 10/10/14   Louis Meckel, MD  metoprolol succinate (TOPROL-XL) 50 MG 24 hr tablet Take 50 mg by mouth daily. 08/03/14   Historical Provider, MD  sertraline (ZOLOFT) 50 MG tablet Take 50 mg by mouth daily.    Historical Provider, MD  telmisartan (MICARDIS) 40 MG tablet Take 40 mg by mouth daily. 07/28/14   Historical Provider, MD    BP 135/95 mmHg  Pulse 66  Temp(Src) 98.5 F (36.9 C) (Oral)  Resp 20  Ht  (1.651 m)  Wt 188 lb (  85.276 kg)  BMI 31.28 kg/m2  SpO2 100% Physical Exam  Constitutional: She is oriented to person, place, and time. She appears well-developed and well-nourished. No distress.  HENT:  Head: Normocephalic and atraumatic.  Right Ear: External ear normal.  Left Ear: External ear normal.  Nose: Nose normal.  Mouth/Throat: Uvula is midline, oropharynx is clear and moist and mucous membranes are normal. No oropharyngeal exudate.  Eyes: Conjunctivae, EOM and lids are normal. Pupils are equal, round, and reactive to light. Right eye exhibits no discharge. Left eye exhibits no discharge. No scleral icterus.  Neck: Normal range of motion. Neck supple.  Cardiovascular: Normal rate, regular rhythm, normal heart sounds, intact distal pulses  and normal pulses.   Pulmonary/Chest: Effort normal and breath sounds normal. No respiratory distress. She has no wheezes. She has no rales.  Abdominal: Soft. Normal appearance and bowel sounds are normal. She exhibits no distension and no mass. There is no tenderness. There is no rigidity, no rebound and no guarding.  Musculoskeletal: Normal range of motion. She exhibits no edema or tenderness.  Neurological: She is alert and oriented to person, place, and time.  Skin: Skin is warm, dry and intact. No rash noted. She is not diaphoretic. No erythema. No pallor.  Psychiatric: She has a normal mood and affect. Her speech is normal and behavior is normal.  Nursing note and vitals reviewed.   ED Course  Procedures (including critical care time)  Labs Review Labs Reviewed  CBC WITH DIFFERENTIAL/PLATELET  BASIC METABOLIC PANEL    Imaging Review Dg Chest 2 View  04/30/2015  CLINICAL DATA:  Productive cough and shortness of breath for the past 2 weeks. EXAM: CHEST  2 VIEW COMPARISON:  08/23/2013. FINDINGS: Normal sized heart. Clear lungs. Minimal central peribronchial thickening. Unremarkable bones. IMPRESSION: Minimal bronchitic changes. Electronically Signed   By: Beckie SaltsSteven  Reid M.D.   On: 04/30/2015 15:56   I have personally reviewed and evaluated these images and lab results as part of my medical decision-making.   EKG Interpretation None      MDM   Final diagnoses:  Bronchitis    39 year old female presents with cough productive of yellow sputum 2 weeks. Reports associated nasal congestion and generalized fatigue. Denies fever, chills, headache, lightheadedness, dizziness, chest pain, shortness of breath, abdominal pain, nausea, vomiting, diarrhea, constipation, numbness, weakness, paresthesia.  Patient is afebrile. Vital signs stable. Heart regular rate and rhythm. Lungs clear to auscultation bilaterally. Abdomen soft, nontender, nondistended. No lower extremity edema.  CBC  negative for leukocytosis or anemia. BMP within normal limits. Chest x-ray remarkable for minimal bronchitic changes. Will treat with cough medication and give albuterol inhaler. Patient to follow up with primary care provider. Return precautions discussed. Patient verbalizes her understanding and is in agreement with plan.  BP 135/95 mmHg  Pulse 66  Temp(Src) 98.5 F (36.9 C) (Oral)  Resp 20  Ht 5\' 5"  (1.651 m)  Wt 188 lb (85.276 kg)  BMI 31.28 kg/m2  SpO2 100%     Mady Gemmalizabeth C Sharae Zappulla, PA-C 04/30/15 1721  Tilden FossaElizabeth Rees, MD 04/30/15 2344

## 2015-04-30 NOTE — Discharge Instructions (Signed)
1. Medications: inhaler, cough medicine, usual home medications 2. Treatment: rest, drink plenty of fluids 3. Follow Up: please followup with your primary doctor this week for discussion of your diagnoses and further evaluation after today's visit; if you do not have a primary care doctor use the resource guide provided to find one; please return to the ER for chest pain, shortness of breath, new or worsening symptoms   Upper Respiratory Infection, Adult Most upper respiratory infections (URIs) are a viral infection of the air passages leading to the lungs. A URI affects the nose, throat, and upper air passages. The most common type of URI is nasopharyngitis and is typically referred to as "the common cold." URIs run their course and usually go away on their own. Most of the time, a URI does not require medical attention, but sometimes a bacterial infection in the upper airways can follow a viral infection. This is called a secondary infection. Sinus and middle ear infections are common types of secondary upper respiratory infections. Bacterial pneumonia can also complicate a URI. A URI can worsen asthma and chronic obstructive pulmonary disease (COPD). Sometimes, these complications can require emergency medical care and may be life threatening.  CAUSES Almost all URIs are caused by viruses. A virus is a type of germ and can spread from one person to another.  RISKS FACTORS You may be at risk for a URI if:   You smoke.   You have chronic heart or lung disease.  You have a weakened defense (immune) system.   You are very young or very old.   You have nasal allergies or asthma.  You work in crowded or poorly ventilated areas.  You work in health care facilities or schools. SIGNS AND SYMPTOMS  Symptoms typically develop 2-3 days after you come in contact with a cold virus. Most viral URIs last 7-10 days. However, viral URIs from the influenza virus (flu virus) can last 14-18 days and are  typically more severe. Symptoms may include:   Runny or stuffy (congested) nose.   Sneezing.   Cough.   Sore throat.   Headache.   Fatigue.   Fever.   Loss of appetite.   Pain in your forehead, behind your eyes, and over your cheekbones (sinus pain).  Muscle aches.  DIAGNOSIS  Your health care provider may diagnose a URI by:  Physical exam.  Tests to check that your symptoms are not due to another condition such as:  Strep throat.  Sinusitis.  Pneumonia.  Asthma. TREATMENT  A URI goes away on its own with time. It cannot be cured with medicines, but medicines may be prescribed or recommended to relieve symptoms. Medicines may help:  Reduce your fever.  Reduce your cough.  Relieve nasal congestion. HOME CARE INSTRUCTIONS   Take medicines only as directed by your health care provider.   Gargle warm saltwater or take cough drops to comfort your throat as directed by your health care provider.  Use a warm mist humidifier or inhale steam from a shower to increase air moisture. This may make it easier to breathe.  Drink enough fluid to keep your urine clear or pale yellow.   Eat soups and other clear broths and maintain good nutrition.   Rest as needed.   Return to work when your temperature has returned to normal or as your health care provider advises. You may need to stay home longer to avoid infecting others. You can also use a face mask and careful hand  washing to prevent spread of the virus.  Increase the usage of your inhaler if you have asthma.   Do not use any tobacco products, including cigarettes, chewing tobacco, or electronic cigarettes. If you need help quitting, ask your health care provider. PREVENTION  The best way to protect yourself from getting a cold is to practice good hygiene.   Avoid oral or hand contact with people with cold symptoms.   Wash your hands often if contact occurs.  There is no clear evidence that  vitamin C, vitamin E, echinacea, or exercise reduces the chance of developing a cold. However, it is always recommended to get plenty of rest, exercise, and practice good nutrition.  SEEK MEDICAL CARE IF:   You are getting worse rather than better.   Your symptoms are not controlled by medicine.   You have chills.  You have worsening shortness of breath.  You have brown or red mucus.  You have yellow or brown nasal discharge.  You have pain in your face, especially when you bend forward.  You have a fever.  You have swollen neck glands.  You have pain while swallowing.  You have white areas in the back of your throat. SEEK IMMEDIATE MEDICAL CARE IF:   You have severe or persistent:  Headache.  Ear pain.  Sinus pain.  Chest pain.  You have chronic lung disease and any of the following:  Wheezing.  Prolonged cough.  Coughing up blood.  A change in your usual mucus.  You have a stiff neck.  You have changes in your:  Vision.  Hearing.  Thinking.  Mood. MAKE SURE YOU:   Understand these instructions.  Will watch your condition.  Will get help right away if you are not doing well or get worse.   This information is not intended to replace advice given to you by your health care provider. Make sure you discuss any questions you have with your health care provider.   Document Released: 12/10/2000 Document Revised: 10/31/2014 Document Reviewed: 09/21/2013 Elsevier Interactive Patient Education 2016 [employer]   Emergency Department Resource Guide 1) Find a Doctor and Pay Out of Pocket Although you won't have to find out who is covered by your insurance plan, it is a good idea to ask around and get recommendations. You will then need to call the office and see if the doctor you have chosen will accept you as a new patient and what types of options they offer for patients who are self-pay. Some doctors offer discounts or will set up payment plans  for their patients who do not have insurance, but you will need to ask so you aren't surprised when you get to your appointment.  2) Contact Your Local Health Department Not all health departments have doctors that can see patients for sick visits, but many do, so it is worth a call to see if yours does. If you don't know where your local health department is, you can check in your phone book. The CDC also has a tool to help you locate your state's health department, and many state websites also have listings of all of their local health departments.  3) Find a Walk-in Clinic If your illness is not likely to be very severe or complicated, you may want to try a walk in clinic. These are popping up all over the country in pharmacies, drugstores, and shopping centers. They're usually staffed by nurse practitioners or physician assistants that have been trained to treat  common illnesses and complaints. They're usually fairly quick and inexpensive. However, if you have serious medical issues or chronic medical problems, these are probably not your best option.  No Primary Care Doctor: - Call Health Connect at  [phone] - they can help you locate a primary care doctor that  accepts your insurance, provides certain services, etc. - Physician Referral Service- [phone]  Chronic Pain Problems: Organization         Address  Phone   Notes  [facility]  [phone]) [phone] Patients need to be referred by their primary care doctor.   Medication Assistance: Organization         Address  Phone   Notes  [facility] [address] [place], [place] [address] [phone] --Must be a resident of [facility] [place] -- Must have NO insurance coverage whatsoever (no Medicaid/ Medicare, etc.) -- The pt. MUST have a primary care doctor that directs their care regularly and follows them in the community   MedAssist  [phone]   [employer]  [phone])  [phone]    Agencies that provide inexpensive medical care: Organization         Address  Phone   Notes  [facility]  [phone]   [facility]    [phone]   [facility] [address] [place], [place] [address] [phone]   [facility] [address], [place] [phone]   [facility]anned Parenthood    [phone]   [facility]    [phone]   [facility]  [address], [place] Phone:  [phone], Fax:  [phone] Hours of Operation:  9 am - 6 pm, M-F.  Also accepts Medicaid/Medicare and self-pay.  [facility]  [address], [place] Phone: [phone], Fax: [phone]. Hours of Operation:  8:30 am - 5:30 pm, M-F.  Also accepts Medicaid and self-pay.  [facility] [place] [address], [place] Phone: [phone](336) [phone]   [facility] [address], [place], [place] [phone], Ext. [phone] Mondays & Thursdays: 7-9 AM.  First 15 patients are seen on a first come, first serve basis.    Medicaid-accepting [facility] Providers:  Organization         Address  Phone   Notes  [facility] [address], [place] [phone]) [phone] Also accepts self-pay patients.  [facility] [address], [place]  [phone]   [facility] [address], [place] [phone]) [phone]   [facility] [address] [place] [address], [place] [phone]) [phone]   Renaye RakersVeita Bland [address], [place]   [phone] Only accepts [place] Access [place] patients after they have their name applied to their card.   Self-Pay (no insurance) in [facility]:  Organization         Address  Phone   Notes  Sickle Cell Patients, [facility] [address], [place] [phone])  [phone]   [facility] [address], [place] [phone]   [facility] [place]  [address], [place] [phone]   [facility]/Dr. Osei-Bonsu  [address] [place] [address], [place] or [phone], [address], [place] [phone] Phone number for both [facility] and [place] locations is the same.  [facility] [address], [place] [phone]   [facility] [address] [place] or [address] [phone]   [facility] [address] [place] [phone], phone; [phone], fax Sees patients 1st and 3rd Saturday of every month.  Must not qualify for public or private insurance (i.e. Medicaid, Medicare, [place] Health Choice, Veterans' Benefits)  Household income should be no more than 200% of the poverty level The clinic cannot treat you if you are pregnant or think you are pregnant  Sexually transmitted diseases are not treated at the clinic.    Dental Care: Organization         Address  Phone  Notes  [facility] [address] [place], [place] [phone] Accepts children up to age 57 who are enrolled in [place] or [place] Health Choice; pregnant women with a Medicaid card; and children who have applied for Medicaid or [place] Health Choice, but were declined, whose parents can pay a reduced fee at time of service.  [facility] of [facility]  [address], [place] [phone] Accepts children up to age 45 who are enrolled in [place] or [place] Health Choice; pregnant women with a Medicaid card; and children who have applied for Medicaid or [place] Health Choice, but were declined, whose parents can pay a reduced fee at time of service.  [facility]  [address] [place], [place] [phone] Patients are  seen by appointment only. Walk-ins are not accepted. [facility] will see patients 61 years of age and older. Monday - Tuesday (8am-5pm) Most Wednesdays (8:30-5pm) $30 per visit, cash only  [facility] Adult Dental Access PROGRAM  [address], [facility] [phone] Patients are seen by appointment only. Walk-ins are not accepted. [facility] will see patients 45 years of age and older. One Wednesday Evening (Monthly: Volunteer Based).  $30 per visit, cash only  [employer]  [phone] for adults; Children under age 65, call Graduate Pediatric Dentistry at [phone]. Children aged 71-14, please call [phone] to request a pediatric application.  Dental services are provided in all areas of dental care including fillings, crowns and bridges, complete and partial dentures, implants, gum treatment, root canals, and extractions. Preventive care is also provided. Treatment is provided to both adults and children. Patients are selected via a lottery and there is often a waiting list.   [facility] [address] [place]  [phone] [email or web address]   [facility] [address] [place], [place] [phone], Ext. [phone] Second and Fourth Thursday of each month, opens at 6:30 AM; Clinic ends at 9 AM.  Patients are seen on a first-come first-served basis, and a limited number are seen during each clinic.   [facility]  [address] [place], [place] [phone]   Eligibility Requirements You must have lived in [place], [place], or [place] counties for at least the last three months.   You cannot be eligible for state or federal sponsored [place], including [employer], [place], or [employer].   You generally cannot be eligible for healthcare insurance through your employer.    How to apply: Eligibility screenings are held every Tuesday and  Wednesday afternoon from 1:00 pm until 4:00  pm. You do not need an appointment for the interview!  [facility] [address] [place], [place] [phone]   [facility] Health Department  [phone]   [facility] Health Department  [phone]   [facility]  [phone]    Behavioral Health Resources in the Community: Intensive Outpatient Programs Organization         Address  Phone  Notes  [facility] [address] [place], [place] [phone]   [facility]patient [address], [place], [place] [phone]   [facility] [address], [place], [place]  [phone]   [facility] Mental Health [address]  [place], [place] [phone] or [phone]   Substance Abuse Resources Organization         Address  Phone  Notes  [facility] Drug Services  [phone]   [facility]  [phone]   [facility]  [phone]   Floydene Flock  [phone]   Residential & Outpatient Substance Abuse Program  [phone]   Psychological Services Organization         Address  Phone  Notes  [facility] Health  [phone]   [facility] Services  [phone]   [facility] Mental Health [address] [place] [phone] or [phone]    Mobile Crisis Teams Organization         Address  Phone  Notes  [facility], Mobile Crisis Care Unit  [phone]   [facility]  [address] [place], [place] [phone]   Doristine Locks [address] [place] [phone]    Self-Help/Support Groups Organization         Address  Phone             Notes  [facility] [place] - variety of support groups  336- I7437963 Call for more information  Narcotics Anonymous (NA), [facility] [address], [employer] [place]  2 meetings at this location   Statistician          Address  Phone  Notes  [facility] [address],    [place]  [phone]   [facility]  [address], [place] [address], [place], [place] [phone]   [facility] [address] [place], [place] [phone] Admissions: 8am-3pm M-F  [facility] [address]    [place], [place] [phone]   [facility] [address] [place], [place], [place] [phone]   [facility] [address]  [place], [place] [phone]   [facility] - Intensive Outpatient [address], [place], [place] [phone]   [facility] ([facility] [address] [place].,  [place], [place] [phone] or [phone]   [facility] ([facility]) [address] [place], [place] [phone] Accepts Medicaid  [facility] [address]  [place] [phone] Substance Abuse/Addiction Treatment   [facility] Organization         Address  Phone  Notes  [facility]  [phone]   Angie Fava, PhD [address]vin Knack [place], [place]   [phone] or [phone]   [facility]   [address] [place], [place] [phone]   [facility] [address] [place], [place] [phone] Insurance/Medicaid/sponsorship through [employer]  and Families [address], [place], [place] [phone] [place], [place] [phone]    Dr. Adele Schilder  [phone]   [facility] Dept. 1) [address], [place] 2) [place] 3)  [place] [address], [place] [phone]  [phone]   [place] [phone] or [phone] (After Hours)

## 2015-04-30 NOTE — ED Notes (Signed)
C/o prod cough x 2 weeks 

## 2015-08-09 IMAGING — US US ABDOMEN COMPLETE
1 series · 14 of 25 positions shown · non-contrast
Comparison: CT 07/22/2006.

CLINICAL DATA: Nausea and vomiting.

EXAM:
ULTRASOUND ABDOMEN COMPLETE

[Series 1: us abdomen complete · 0.24mm/px · 14 of 103 slices shown]
[im 1/103]
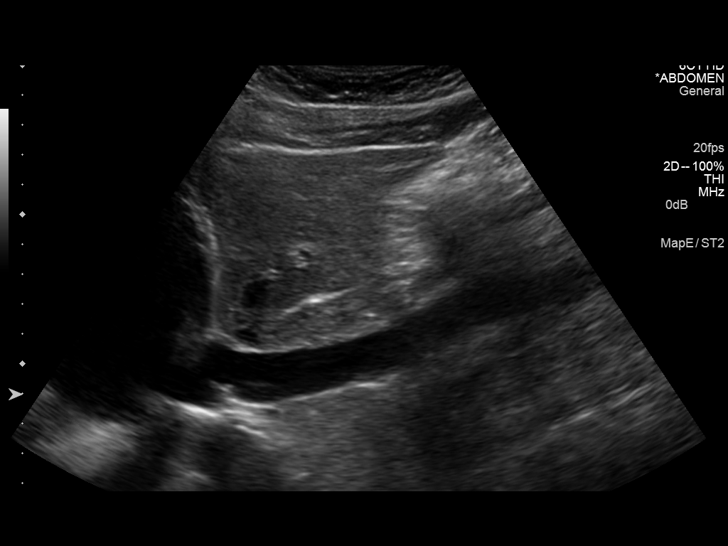
[im 9/103]
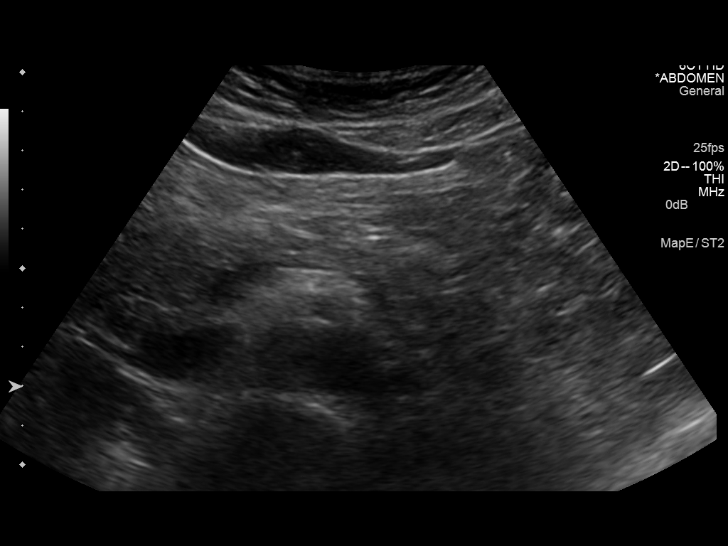
[im 18/103]
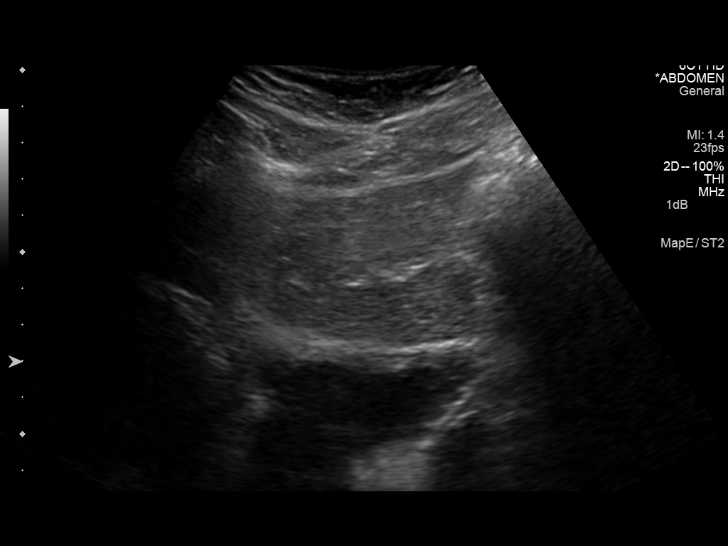
[im 26/103]
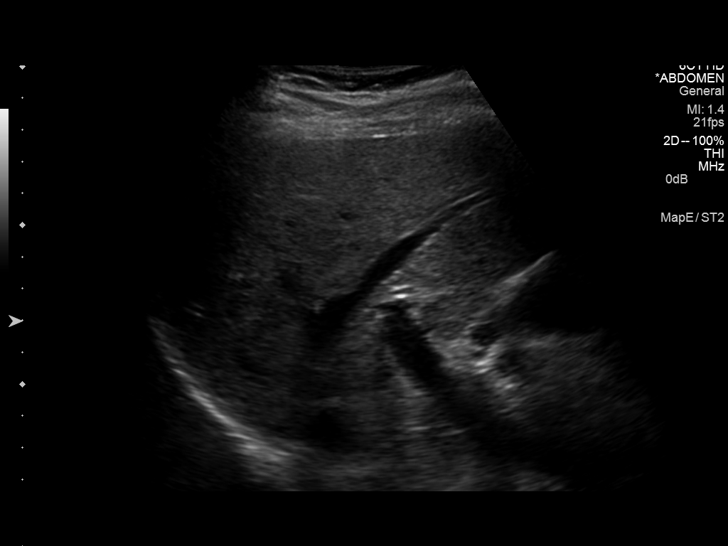
[im 35/103]
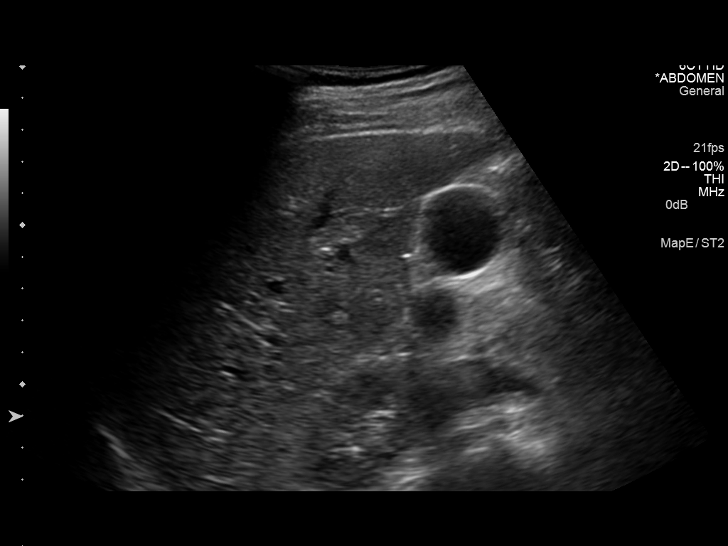
[im 39/103]
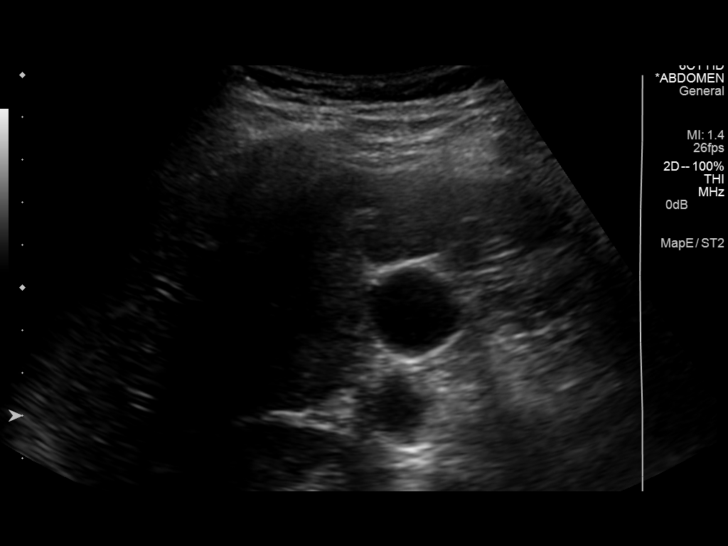
[im 47/103]
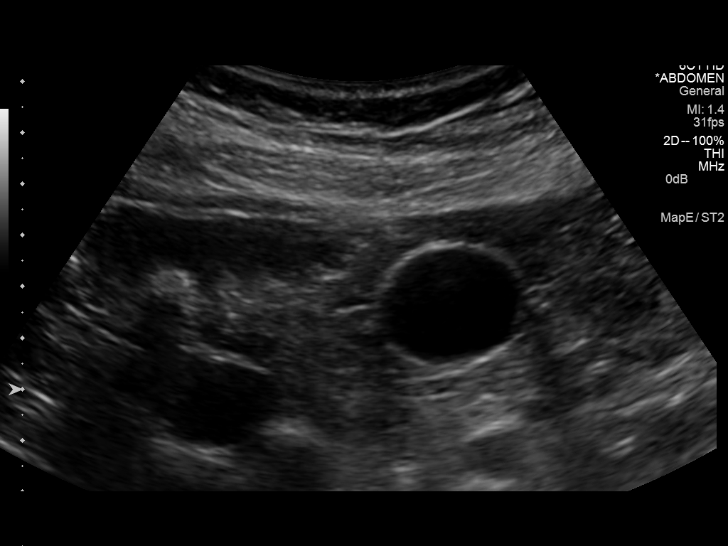
[im 56/103]
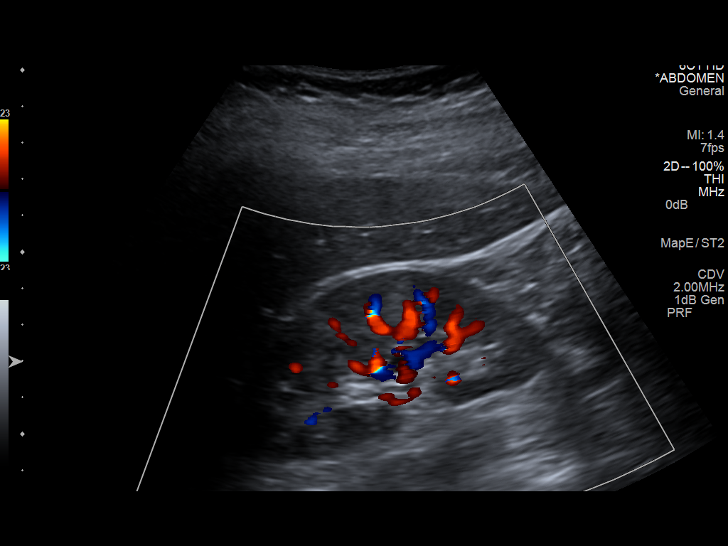
[im 64/103]
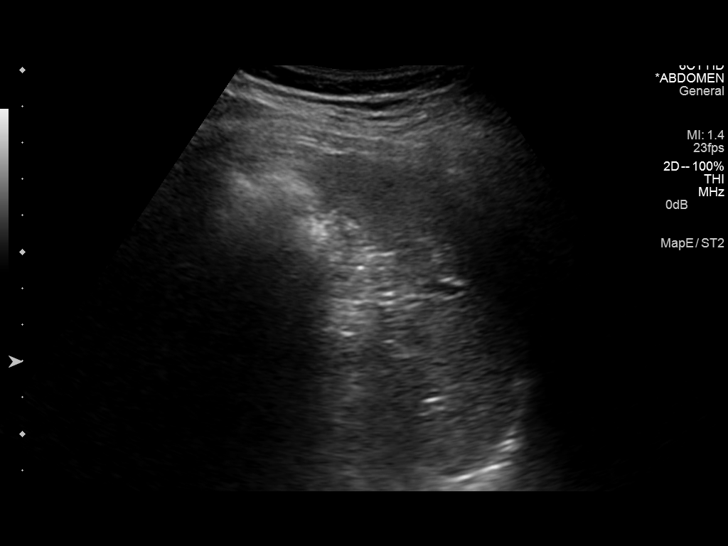
[im 69/103]
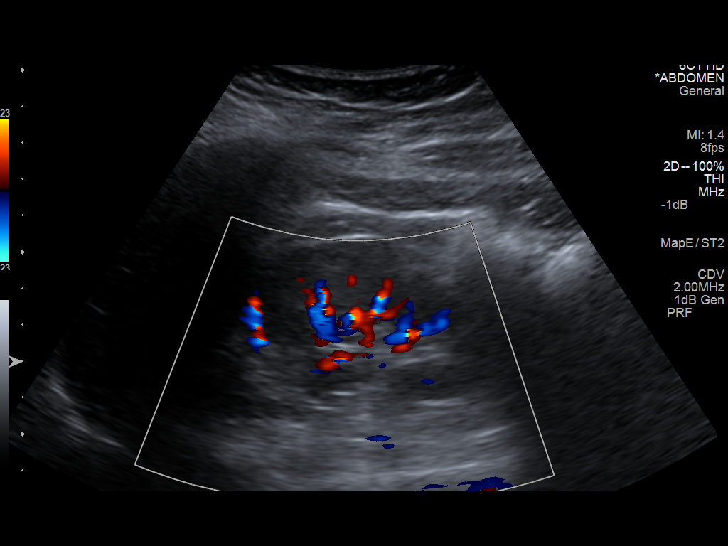
[im 77/103]
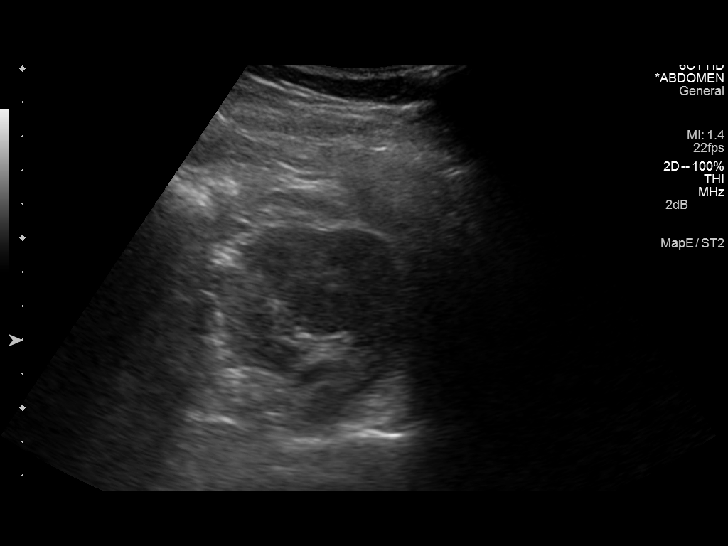
[im 86/103]
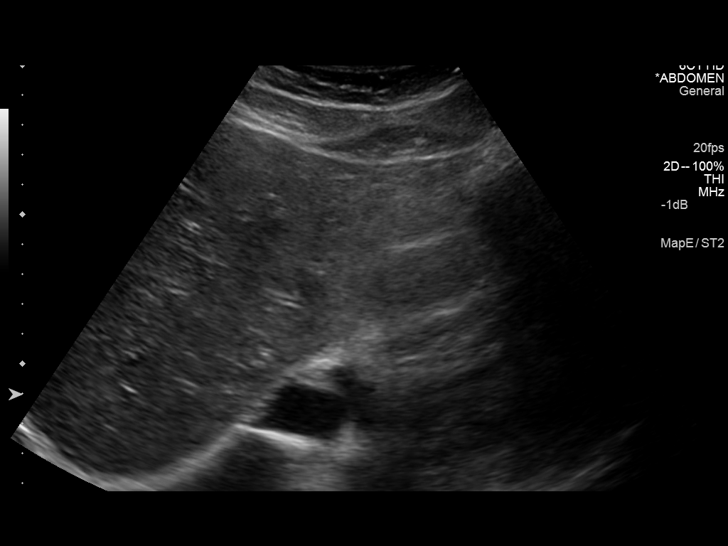
[im 94/103]
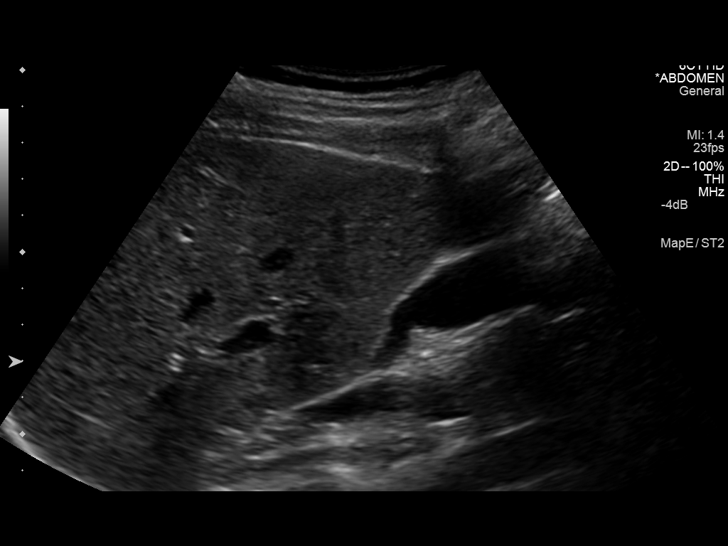
[im 103/103]
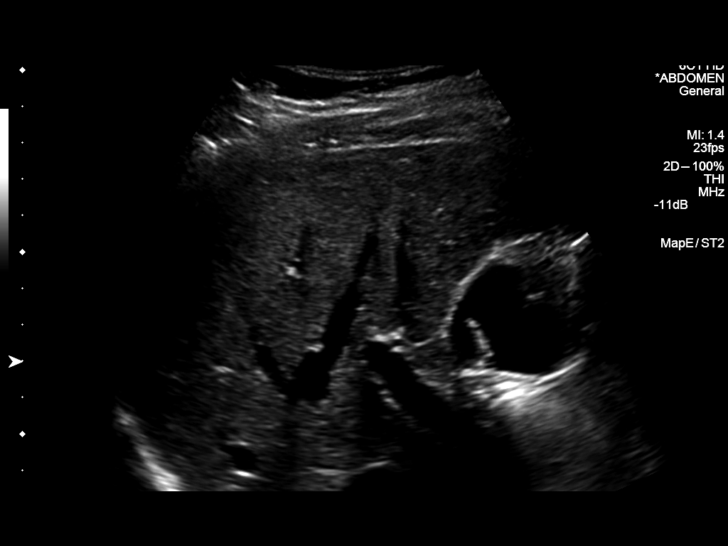

[14 of 25 positions shown; findings below may reference images not displayed]

FINDINGS: Gallbladder: No gallstones or wall thickening visualized. No
sonographic Murphy sign noted.

Common bile duct: Diameter: 3.3 mm

Liver: No focal lesion identified. Within normal limits in
parenchymal echogenicity.

IVC: No abnormality visualized.

Pancreas: Visualized portion unremarkable.

Spleen: Size and appearance within normal limits.

Right Kidney: Length: 9.8 cm. Echogenicity within normal limits. No
mass or hydronephrosis visualized.

Left Kidney: Length: 9.6 cm. Echogenicity within normal limits. No
mass or hydronephrosis visualized.

Abdominal aorta: No aneurysm visualized.

Other findings: None.
IMPRESSION: Normal exam.

## 2015-11-13 ENCOUNTER — Other Ambulatory Visit: Payer: Self-pay | Admitting: Internal Medicine

## 2015-11-13 DIAGNOSIS — R102 Pelvic and perineal pain: Secondary | ICD-10-CM

## 2015-11-14 ENCOUNTER — Ambulatory Visit
Admission: RE | Admit: 2015-11-14 | Discharge: 2015-11-14 | Disposition: A | Discharge status: Home / Self Care | Payer: Managed Care, Other (non HMO) | Source: Ambulatory Visit | Attending: Internal Medicine | Admitting: Internal Medicine

## 2015-11-14 DIAGNOSIS — R102 Pelvic and perineal pain: Secondary | ICD-10-CM

## 2016-03-20 ENCOUNTER — Other Ambulatory Visit: Payer: Self-pay | Admitting: Internal Medicine

## 2016-03-20 DIAGNOSIS — N83209 Unspecified ovarian cyst, unspecified side: Secondary | ICD-10-CM

## 2016-04-08 ENCOUNTER — Emergency Department (HOSPITAL_COMMUNITY): Payer: Managed Care, Other (non HMO)

## 2016-04-08 ENCOUNTER — Emergency Department (HOSPITAL_COMMUNITY)
Admission: EM | Admit: 2016-04-08 | Discharge: 2016-04-08 | Disposition: A | Discharge status: Home / Self Care | Payer: Managed Care, Other (non HMO) | Attending: Emergency Medicine | Admitting: Emergency Medicine

## 2016-04-08 ENCOUNTER — Encounter (HOSPITAL_COMMUNITY): Payer: Self-pay

## 2016-04-08 DIAGNOSIS — I2510 Atherosclerotic heart disease of native coronary artery without angina pectoris: Secondary | ICD-10-CM | POA: Insufficient documentation

## 2016-04-08 DIAGNOSIS — R110 Nausea: Secondary | ICD-10-CM | POA: Diagnosis present

## 2016-04-08 DIAGNOSIS — R109 Unspecified abdominal pain: Secondary | ICD-10-CM | POA: Diagnosis not present

## 2016-04-08 DIAGNOSIS — K5900 Constipation, unspecified: Secondary | ICD-10-CM | POA: Insufficient documentation

## 2016-04-08 DIAGNOSIS — Z79899 Other long term (current) drug therapy: Secondary | ICD-10-CM | POA: Insufficient documentation

## 2016-04-08 DIAGNOSIS — I10 Essential (primary) hypertension: Secondary | ICD-10-CM | POA: Diagnosis not present

## 2016-04-08 LAB — PREGNANCY, URINE: PREG TEST UR: NEGATIVE

## 2016-04-08 LAB — URINALYSIS, ROUTINE W REFLEX MICROSCOPIC
BILIRUBIN URINE: NEGATIVE
Glucose, UA: NEGATIVE mg/dL
HGB URINE DIPSTICK: NEGATIVE
Ketones, ur: NEGATIVE mg/dL
Leukocytes, UA: NEGATIVE
NITRITE: NEGATIVE
PROTEIN: NEGATIVE mg/dL
Specific Gravity, Urine: 1.023 (ref 1.005–1.030)
pH: 6.5 (ref 5.0–8.0)

## 2016-04-08 LAB — CBC
HCT: 40.3 % (ref 36.0–46.0)
HEMOGLOBIN: 13.3 g/dL (ref 12.0–15.0)
MCH: 30.5 pg (ref 26.0–34.0)
MCHC: 33 g/dL (ref 30.0–36.0)
MCV: 92.4 fL (ref 78.0–100.0)
PLATELETS: 225 10*3/uL (ref 150–400)
RBC: 4.36 MIL/uL (ref 3.87–5.11)
RDW: 12.6 % (ref 11.5–15.5)
WBC: 6.2 10*3/uL (ref 4.0–10.5)

## 2016-04-08 LAB — COMPREHENSIVE METABOLIC PANEL
ALBUMIN: 4.3 g/dL (ref 3.5–5.0)
ALT: 15 U/L (ref 14–54)
AST: 19 U/L (ref 15–41)
Alkaline Phosphatase: 46 U/L (ref 38–126)
Anion gap: 6 (ref 5–15)
BUN: 14 mg/dL (ref 6–20)
CALCIUM: 9 mg/dL (ref 8.9–10.3)
CHLORIDE: 106 mmol/L (ref 101–111)
CO2: 25 mmol/L (ref 22–32)
CREATININE: 0.88 mg/dL (ref 0.44–1.00)
GFR calc Af Amer: >60 mL/min (ref >60)
Glucose, Bld: 82 mg/dL (ref 65–99)
POTASSIUM: 4 mmol/L (ref 3.5–5.1)
Sodium: 137 mmol/L (ref 135–145)
TOTAL PROTEIN: 7.4 g/dL (ref 6.5–8.1)
Total Bilirubin: 0.9 mg/dL (ref 0.3–1.2)

## 2016-04-08 LAB — LIPASE, BLOOD: LIPASE: 20 U/L (ref 11–51)

## 2016-04-08 MED ORDER — MAGNESIUM CITRATE PO SOLN
1.0000 | Freq: Once | ORAL | Status: AC
  Administered 2016-04-08: 1 via ORAL
  Filled 2016-04-08: qty 296

## 2016-04-08 MED ORDER — PEG 3350-KCL-NABCB-NACL-NASULF 236 G PO SOLR: 4000.0000 mL | Freq: Once | ORAL | 0 refills | Status: AC

## 2016-04-08 MED ORDER — MILK AND MOLASSES ENEMA
1.0000 | Freq: Once | RECTAL | Status: DC
  Filled 2016-04-08: qty 250

## 2016-04-08 NOTE — ED Triage Notes (Signed)
Pt presents with c/o generalized abdominal pain. Pt reports that she feels bloated and is constipated. Pt also reports some nausea but no vomiting as well as pain that radiates to her back as well. Pt reports she has been dealing with these symptoms for approx one month. Has tried beano and miralax with no relief.

## 2016-04-08 NOTE — ED Provider Notes (Signed)
[facility]-EMERGENCY DEPT Provider Note   CSN: [id number] Arrival date & time: 04/08/16  1046     History   Chief Complaint Chief Complaint  Patient presents with  . Abdominal Pain  . Nausea    HPI Michele Frazier is a 40 y.o. female.  HPI Michele Frazier is a 40 y.o. female with hx of CAD, depression, HTN, presents to ED with complaint of lower abdominal pain radiating to the back. Patient states that she has had increasing pain over the month. She feels like her abdomen swells up. She reports associated constipation. She has tried Dulcolax, MiraLAX daily, has tried prune juice with no relief. She states that she went to her OB/GYN in 1 week ago, that time had pelvic exam, had ultrasound, was told that everything looks normal. She states she is unable to perform daily activities or exercise because of abdominal discomfort. Denies nausea or vomiting. Had bowel movement yesterday but it was small. Denies fever or chills. No urinary symptoms. Denies being pregnant.  Past Medical History:  Diagnosis Date  . Arthritis   . Coronary artery disease   . Depression   . Hypertension   . Pneumonia     There are no active problems to display for this patient.   Past Surgical History:  Procedure Laterality Date  . ABDOMINAL HYSTERECTOMY    . BUNIONECTOMY  07/05/2014  . CARDIAC CATHETERIZATION      OB History    No data available       Home Medications    Prior to Admission medications   Medication Sig Start Date End Date Taking? Authorizing Provider  acetaminophen (TYLENOL) 500 MG tablet Take 500 mg by mouth every 6 (six) hours as needed for mild pain, moderate pain, fever or headache.   Yes Historical Provider, MD  Alpha-D-Galactosidase Charlyne Quale(BEANO) TABS Take 2 tablets by mouth 3 (three) times daily with meals.   Yes Historical Provider, MD  amLODipine (NORVASC) 10 MG tablet Take 10 mg by mouth daily. 08/04/14  Yes Historical Provider, MD  Cholecalciferol (VITAMIN D PO) Take 1 tablet by  mouth daily.   Yes Historical Provider, MD  ibuprofen (ADVIL,MOTRIN) 800 MG tablet Take 800 mg by mouth every 8 (eight) hours as needed for fever, headache, mild pain, moderate pain or cramping.   Yes Historical Provider, MD  metoprolol succinate (TOPROL-XL) 50 MG 24 hr tablet Take 50 mg by mouth daily. 08/03/14  Yes Historical Provider, MD  polyethylene glycol (MIRALAX / GLYCOLAX) packet Take 17 g by mouth 2 (two) times daily.   Yes Historical Provider, MD  Probiotic Product (PROBIOTIC DAILY) CAPS Take 1 capsule by mouth daily.   Yes Historical Provider, MD  sertraline (ZOLOFT) 50 MG tablet Take 50 mg by mouth daily.   Yes Historical Provider, MD  telmisartan (MICARDIS) 40 MG tablet Take 40 mg by mouth daily. 07/28/14  Yes Historical Provider, MD    Family History Family History  Problem Relation Age of Onset  . Cancer Maternal Grandmother     Pt did not know what type  . Cancer Maternal Grandfather     Pt did not know what type  . Diabetes Mother   . Heart disease Mother   . Kidney disease Paternal Uncle     x2  . Colon cancer Neg Hx   . Colon polyps Neg Hx   . Esophageal cancer Neg Hx   . Gallbladder disease Neg Hx     Social History Social History  Substance Use  Topics  . Smoking status: Never Smoker  . Smokeless tobacco: Never Used  . Alcohol use 0.0 oz/week     Comment: Occassoinally     Allergies   Latex and Vicodin [hydrocodone-acetaminophen]   Review of Systems Review of Systems  Constitutional: Negative for chills and fever.  Respiratory: Negative for cough, chest tightness and shortness of breath.   Cardiovascular: Negative for chest pain, palpitations and leg swelling.  Gastrointestinal: Positive for abdominal pain and constipation. Negative for diarrhea, nausea and vomiting.  Genitourinary: Negative for dysuria, flank pain, pelvic pain, vaginal bleeding, vaginal discharge and vaginal pain.  Musculoskeletal: Negative for arthralgias, myalgias, neck pain and  neck stiffness.  Skin: Negative for rash.  Neurological: Negative for dizziness, weakness and headaches.  All other systems reviewed and are negative.    Physical Exam Updated Vital Signs BP 139/87 (BP Location: Left Arm)   Pulse 84   Temp 98.4 F (36.9 C) (Oral)   Resp 12   Ht 5\' 5"  (1.651 m)   Wt 92.1 kg   SpO2 100%   BMI 33.78 kg/m   Physical Exam  Constitutional: She appears well-developed and well-nourished. No distress.  HENT:  Head: Normocephalic.  Eyes: Conjunctivae are normal.  Neck: Neck supple.  Cardiovascular: Normal rate, regular rhythm and normal heart sounds.   Pulmonary/Chest: Effort normal and breath sounds normal. No respiratory distress. She has no wheezes. She has no rales.  Abdominal: Soft. Bowel sounds are normal. She exhibits no distension. There is no tenderness. There is no rebound.  Musculoskeletal: She exhibits no edema.  Neurological: She is alert.  Skin: Skin is warm and dry.  Psychiatric: She has a normal mood and affect. Her behavior is normal.  Nursing note and vitals reviewed.    ED Treatments / Results  Labs (all labs ordered are listed, but only abnormal results are displayed) Labs Reviewed  LIPASE, BLOOD  COMPREHENSIVE METABOLIC PANEL  CBC  URINALYSIS, ROUTINE W REFLEX MICROSCOPIC (NOT AT [facility])  PREGNANCY, URINE    EKG  EKG Interpretation None       Radiology Dg Abd 2 Views  Result Date: 04/08/2016 CLINICAL DATA:  40 year old female with abdominal gas and pain for 1 month with nausea. Initial encounter. EXAM: ABDOMEN - 2 VIEW COMPARISON:  Abdominal radiographs 03/17/2016. CT Abdomen 07/22/2006 FINDINGS: Upright and supine views. Negative lung bases. No pneumoperitoneum. Non obstructed bowel gas pattern. Moderate volume of retained stool throughout the colon. Normal abdominal and pelvic visceral contours. No acute osseous abnormality identified. IMPRESSION: Normal bowel gas pattern, no free air. Moderate volume of retained  stool in the colon. Electronically Signed   By: Odessa Fleming M.D.   On: 04/08/2016 13:31    Procedures Procedures (including critical care time)  Medications Ordered in ED Medications - No data to display   Initial Impression / Assessment and Plan / ED Course  I have reviewed the triage vital signs and the nursing notes.  Pertinent labs & imaging results that were available during my care of the patient were reviewed by me and considered in my medical decision making (see chart for details).  Clinical Course   Patient seen and examined. Patient with ongoing abdominal discomfort and bloating for month. Had negative pelvic examination, cultures and ultrasound by her OB/GYN 1 week ago. She's complaining of constipation. She has tried multiple medications at home including enemas, MiraLAX, Dulcolax, laxatives. Labs obtained by triage and are back, all normal. Urinalysis and pregnancy tests are negative. I obtained x-ray of  her abdomen which showed moderate volume of retained stool throughout colon. I offered patient an enema and magnesium citrate. Will try that for constipation to see if gets any relief.  Patient refused an enema. She did drink some of Maxitrol. She asked to be discharged home so she can finish drinking at home and be more comfortable in home bathroom. I will discharge her home. I will give prescription for GoLYTELY. I also instructed her to try enemas at home on her own. We discussed bowel regimen, MiraLAX daily, make sure to drink plenty of fluids, increase fiber intake. Follow-up with gastroenterology.  Vitals:   04/08/16 1110 04/08/16 1111 04/08/16 1401 04/08/16 1541  BP: 139/87  140/87 (!) 158/106  Pulse: 84  62 69  Resp: 12  18 18   Temp: 98.4 F (36.9 C)  98.2 F (36.8 C) 98.3 F (36.8 C)  TempSrc: Oral  Oral Oral  SpO2: 100%  100% 100%  Weight:  92.1 kg    Height:  5\' 5"  (1.651 m)       Final Clinical Impressions(s) / ED Diagnoses   Final diagnoses:  Abdominal  pain  Abdominal pain, unspecified abdominal location  Constipation, unspecified constipation type    New Prescriptions Discharge Medication List as of 04/08/2016  3:24 PM    START taking these medications   Details  polyethylene glycol (GOLYTELY) 236 g solution Take 4,000 mLs by mouth once., Starting Tue 04/08/2016, Print         Jaynie Crumble, PA-C 04/08/16 2011    Linwood Dibbles, MD 04/09/16 2136

## 2016-04-08 NOTE — Discharge Instructions (Signed)
Take MiraLAX, double dose daily if needed. Try Fleet enema, sold over-the-counter. Try GoLYTELY. Follow-up with gastroenterology.

## 2016-05-28 ENCOUNTER — Ambulatory Visit: Payer: Managed Care, Other (non HMO) | Admitting: Gastroenterology

## 2016-07-09 ENCOUNTER — Ambulatory Visit: Payer: Managed Care, Other (non HMO) | Admitting: Gastroenterology

## 2016-07-09 ENCOUNTER — Telehealth: Payer: Self-pay | Admitting: Gastroenterology

## 2016-07-09 NOTE — Telephone Encounter (Signed)
Noted. No charge. 

## 2016-08-04 ENCOUNTER — Ambulatory Visit: Payer: Managed Care, Other (non HMO) | Admitting: Gastroenterology

## 2016-08-08 ENCOUNTER — Ambulatory Visit (INDEPENDENT_AMBULATORY_CARE_PROVIDER_SITE_OTHER): Payer: Self-pay

## 2016-08-08 ENCOUNTER — Other Ambulatory Visit: Payer: Self-pay | Admitting: Emergency Medicine

## 2016-08-08 DIAGNOSIS — S8981XA Other specified injuries of right lower leg, initial encounter: Secondary | ICD-10-CM

## 2016-08-08 DIAGNOSIS — X501XXA Overexertion from prolonged static or awkward postures, initial encounter: Secondary | ICD-10-CM

## 2016-08-08 DIAGNOSIS — T1490XA Injury, unspecified, initial encounter: Secondary | ICD-10-CM

## 2016-08-13 ENCOUNTER — Telehealth: Payer: Self-pay

## 2016-08-13 NOTE — Telephone Encounter (Signed)
Diclofenac needs Pa 1% gel  [phone] ID  [id number]

## 2016-08-20 NOTE — Telephone Encounter (Signed)
Called pharmacy to notify them that this is not our patient.

## 2017-01-20 ENCOUNTER — Emergency Department (HOSPITAL_BASED_OUTPATIENT_CLINIC_OR_DEPARTMENT_OTHER): Payer: Managed Care, Other (non HMO)

## 2017-01-20 ENCOUNTER — Encounter (HOSPITAL_BASED_OUTPATIENT_CLINIC_OR_DEPARTMENT_OTHER): Payer: Self-pay

## 2017-01-20 ENCOUNTER — Emergency Department (HOSPITAL_BASED_OUTPATIENT_CLINIC_OR_DEPARTMENT_OTHER)
Admission: EM | Admit: 2017-01-20 | Discharge: 2017-01-20 | Disposition: A | Discharge status: Home / Self Care | Payer: Managed Care, Other (non HMO) | Attending: Emergency Medicine | Admitting: Emergency Medicine

## 2017-01-20 DIAGNOSIS — R51 Headache: Secondary | ICD-10-CM | POA: Insufficient documentation

## 2017-01-20 DIAGNOSIS — R519 Headache, unspecified: Secondary | ICD-10-CM

## 2017-01-20 DIAGNOSIS — I2510 Atherosclerotic heart disease of native coronary artery without angina pectoris: Secondary | ICD-10-CM | POA: Insufficient documentation

## 2017-01-20 DIAGNOSIS — I10 Essential (primary) hypertension: Secondary | ICD-10-CM | POA: Insufficient documentation

## 2017-01-20 DIAGNOSIS — Z79899 Other long term (current) drug therapy: Secondary | ICD-10-CM | POA: Diagnosis not present

## 2017-01-20 DIAGNOSIS — Z91040 Latex allergy status: Secondary | ICD-10-CM | POA: Diagnosis not present

## 2017-01-20 LAB — CBC WITH DIFFERENTIAL/PLATELET
BASOS ABS: 0 10*3/uL (ref 0.0–0.1)
Basophils Relative: 0 %
EOS PCT: 0 %
Eosinophils Absolute: 0 10*3/uL (ref 0.0–0.7)
HEMATOCRIT: 38.8 % (ref 36.0–46.0)
Hemoglobin: 13.6 g/dL (ref 12.0–15.0)
LYMPHS ABS: 1.7 10*3/uL (ref 0.7–4.0)
LYMPHS PCT: 18 %
MCH: 31 pg (ref 26.0–34.0)
MCHC: 35.1 g/dL (ref 30.0–36.0)
MCV: 88.4 fL (ref 78.0–100.0)
MONO ABS: 0.5 10*3/uL (ref 0.1–1.0)
MONOS PCT: 5 %
NEUTROS ABS: 7.3 10*3/uL (ref 1.7–7.7)
Neutrophils Relative %: 77 %
PLATELETS: 304 10*3/uL (ref 150–400)
RBC: 4.39 MIL/uL (ref 3.87–5.11)
RDW: 11.7 % (ref 11.5–15.5)
WBC: 9.6 10*3/uL (ref 4.0–10.5)

## 2017-01-20 LAB — COMPREHENSIVE METABOLIC PANEL
ALBUMIN: 4.5 g/dL (ref 3.5–5.0)
ALT: 15 U/L (ref 14–54)
AST: 21 U/L (ref 15–41)
Alkaline Phosphatase: 56 U/L (ref 38–126)
Anion gap: 14 (ref 5–15)
BILIRUBIN TOTAL: 1.2 mg/dL (ref 0.3–1.2)
BUN: 6 mg/dL (ref 6–20)
CO2: 18 mmol/L — AB (ref 22–32)
Calcium: 9.3 mg/dL (ref 8.9–10.3)
Chloride: 106 mmol/L (ref 101–111)
Creatinine, Ser: 0.87 mg/dL (ref 0.44–1.00)
GFR calc Af Amer: >60 mL/min (ref >60)
GFR calc non Af Amer: >60 mL/min (ref >60)
GLUCOSE: 123 mg/dL — AB (ref 65–99)
POTASSIUM: 3.2 mmol/L — AB (ref 3.5–5.1)
SODIUM: 138 mmol/L (ref 135–145)
TOTAL PROTEIN: 7.6 g/dL (ref 6.5–8.1)

## 2017-01-20 LAB — PROTIME-INR
INR: 1
Prothrombin Time: 13.2 seconds (ref 11.4–15.2)

## 2017-01-20 LAB — SEDIMENTATION RATE: Sed Rate: 7 mm/hr (ref 0–22)

## 2017-01-20 LAB — I-STAT CG4 LACTIC ACID, ED: Lactic Acid, Venous: 1.12 mmol/L (ref 0.5–1.9)

## 2017-01-20 MED ORDER — SODIUM CHLORIDE 0.9 % IV BOLUS (SEPSIS)
1000.0000 mL | Freq: Once | INTRAVENOUS | Status: AC
  Administered 2017-01-20: 1000 mL via INTRAVENOUS

## 2017-01-20 MED ORDER — PROCHLORPERAZINE EDISYLATE 5 MG/ML IJ SOLN
10.0000 mg | Freq: Once | INTRAMUSCULAR | Status: AC
  Administered 2017-01-20: 10 mg via INTRAVENOUS
  Filled 2017-01-20: qty 2

## 2017-01-20 MED ORDER — DIPHENHYDRAMINE HCL 50 MG/ML IJ SOLN
25.0000 mg | Freq: Once | INTRAMUSCULAR | Status: AC
  Administered 2017-01-20: 25 mg via INTRAVENOUS
  Filled 2017-01-20: qty 1

## 2017-01-20 MED ORDER — ONDANSETRON HCL 4 MG/2ML IJ SOLN
4.0000 mg | Freq: Once | INTRAMUSCULAR | Status: AC | PRN
  Administered 2017-01-20: 4 mg via INTRAVENOUS
  Filled 2017-01-20: qty 2

## 2017-01-20 NOTE — ED Notes (Signed)
Pt is vomiting, requesting medicine for same.

## 2017-01-20 NOTE — ED Provider Notes (Signed)
[facility]   CSN: [id number] Arrival date & time: 01/20/17  1731     History   Chief Complaint Chief Complaint  Patient presents with  . Headache    HPI Michele Frazier is a 41 y.o. female.  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 She reports she finished work and had a slight headache. She reports that she usually takes her blood pressure medications about that time after work. She reports that she took her metoprolol but did not take her Norvasc because she was trying to not feel so good. She went on to get up fairly severe generalized headache over the next couple hours. She reports is aching and throbbing throughout her head. He denies light sensitivity. No incoordination, focal weakness numbness or tingling. No double vision. She does note that she's had a few mosquito/insect bites on her arms, she however has not had fevers or generalized malaise. Patient reports that she used to get migraines when she was pretty young but hasn't had any in a very long time. Past Medical History:  Diagnosis Date  . Arthritis   . Coronary artery disease   . Depression   . Hypertension   . Pneumonia     There are no active problems to display for this patient.   Past Surgical History:  Procedure Laterality Date  . ABDOMINAL HYSTERECTOMY    . BUNIONECTOMY  07/05/2014  . CARDIAC CATHETERIZATION      OB History    No data available       Home Medications    Prior to Admission medications   Medication Sig Start Date End Date Taking? Authorizing Provider  acetaminophen (TYLENOL) 500 MG tablet Take 500 mg by mouth every 6 (six) hours as needed for mild pain, moderate pain, fever or headache.    [provider]  Alpha-D-Galactosidase Charlyne Quale) TABS Take 2 tablets by mouth 3 (three) times daily with meals.    [provider]  amLODipine (NORVASC) 10 MG tablet Take 10 mg by mouth daily. 08/04/14   [provider]  Cholecalciferol (VITAMIN D PO) Take 1 tablet by mouth daily.    [provider]  ibuprofen (ADVIL,MOTRIN) 800 MG tablet Take 800 mg by mouth every 8 (eight) hours as needed for fever, headache, mild pain, moderate pain or cramping.    [provider]  metoprolol succinate (TOPROL-XL) 50 MG 24 hr tablet Take 50 mg by mouth daily. 08/03/14   [provider]  polyethylene glycol (MIRALAX / GLYCOLAX) packet Take 17 g by mouth 2 (two) times daily.    [provider]  sertraline (ZOLOFT) 50 MG tablet Take 50 mg by mouth daily.    [provider]    Family History Family History  Problem Relation Age of Onset  . Cancer Maternal Grandmother        Pt did not know what type  . Cancer Maternal Grandfather        Pt did not know what type  . Diabetes Mother   . Heart disease Mother   . Kidney disease Paternal Uncle        x2  . Colon cancer Neg Hx   . Colon polyps Neg Hx   . Esophageal cancer Neg Hx   . Gallbladder disease Neg Hx     Social History Social History  Substance Use Topics  . Smoking status: Never Smoker  . Smokeless tobacco: Never Used  .  Alcohol use 0.0 oz/week     Comment: occ     Allergies   Latex and Vicodin [hydrocodone-acetaminophen]   Review of Systems Review of Systems 10 Systems reviewed and are negative for acute change except as noted in the HPI.   Physical Exam Updated Vital Signs BP 120/74 (BP Location: Left Arm)   Pulse 72   Temp 98.2 F (36.8 C) (Oral)   Resp 16   SpO2 100%   Physical Exam  Constitutional: She is oriented to person, place, and time. She appears well-developed and well-nourished. No distress.  Patient is alert and appropriate. She is resting with her eyes closed. She appears mildly uncomfortable but is appropriately interactive and giving additional history without any signs of confusion.  HENT:  Head: Normocephalic and atraumatic.  Right Ear: External ear normal.  Left Ear:  External ear normal.  Nose: Nose normal.  Mouth/Throat: Oropharynx is clear and moist.  Eyes: Pupils are equal, round, and reactive to light. Conjunctivae and EOM are normal.  Neck: Neck supple.  Cardiovascular: Normal rate and regular rhythm.   No murmur heard. Pulmonary/Chest: Effort normal and breath sounds normal. No respiratory distress.  Abdominal: Soft. There is no tenderness.  Musculoskeletal: She exhibits no edema or tenderness.  Neurological: She is alert and oriented to person, place, and time. No cranial nerve deficit. She exhibits normal muscle tone. Coordination normal.  Normal finger-nose examination. No pronator drift. Normal motor strength 4 extremities. Normal heel shin test.  Skin: Skin is warm and dry.  Patient indicates some barely perceptible areas on her forearms where she has had insect bites. At this time, none are easily visible. No vesicular or pustular rashes or lesions.  Psychiatric: She has a normal mood and affect.  Nursing note and vitals reviewed.    ED Treatments / Results  Labs (all labs ordered are listed, but only abnormal results are displayed) Labs Reviewed  COMPREHENSIVE METABOLIC PANEL - Abnormal; Notable for the following:       Result Value   Potassium 3.2 (*)    CO2 18 (*)    Glucose, Bld 123 (*)    All other components within normal limits  CBC WITH DIFFERENTIAL/PLATELET  PROTIME-INR  SEDIMENTATION RATE  I-STAT CG4 LACTIC ACID, ED    EKG  EKG Interpretation None       Radiology Ct Head Wo Contrast  Result Date: 01/20/2017 CLINICAL DATA:  Headache today (severe).  Vomiting. EXAM: CT HEAD WITHOUT CONTRAST TECHNIQUE: Contiguous axial images were obtained from the base of the skull through the vertex without intravenous contrast. COMPARISON:  Brain MRI 05/19/2009 FINDINGS: Brain: Mild generalized atrophy. No intracranial hemorrhage, mass effect, or midline shift. No hydrocephalus. The basilar cisterns are patent. No evidence of  territorial infarct. No extra-axial or intracranial fluid collection. Vascular: No hyperdense vessel. Skull: Normal. Negative for fracture or focal lesion. Sinuses/Orbits: Paranasal sinuses and mastoid air cells are clear. The visualized orbits are unremarkable. Other: None. IMPRESSION: 1.  No acute intracranial abnormality. 2. Mild generalized atrophy. Electronically Signed   By: Rubye Oaks M.D.   On: 01/20/2017 19:39    Procedures Procedures (including critical care time)  Medications Ordered in ED Medications  ondansetron (ZOFRAN) injection 4 mg (4 mg Intravenous Given 01/20/17 1803)  sodium chloride 0.9 % bolus 1,000 mL (0 mLs Intravenous Stopped 01/20/17 2150)  diphenhydrAMINE (BENADRYL) injection 25 mg (25 mg Intravenous Given 01/20/17 1852)  prochlorperazine (COMPAZINE) injection 10 mg (10 mg Intravenous Given 01/20/17 1852)  Initial Impression / Assessment and Plan / ED Course  I have reviewed the triage vital signs and the nursing notes.  Pertinent labs & imaging results that were available during my care of the patient were reviewed by me and considered in my medical decision making (see chart for details).    Recheck 21:30 patient is significantly improved. Headache has resolved. Blood pressure has normalized with treatment of pain.  Final Clinical Impressions(s) / ED Diagnoses   Final diagnoses:  Bad headache   Patient present with headache. She is nontoxic alert. She does not have meningismus. I have low suspicion for infectious etiology. Patient does have history of hypertension. She had taken one of her antihypertensive medications as scheduled but not the amlodipine. Upon presentation, patient's blood pressure was 145/100. With treatment of pain with migraine cocktail, blood pressure normalized to 120\74. At this time, I do have some suspicion for migrainous etiology. Patient does have history of migraine. She is counseled on signs and symptoms which return. She is  counseled on monitoring her blood pressure at home and follow-up with PCP this week. New Prescriptions New Prescriptions   No medications on file     Arby BarrettePfeiffer, Blakeley Scheier, MD 01/20/17 2158

## 2017-01-20 NOTE — ED Notes (Signed)
ED Provider at bedside. 

## 2017-01-20 NOTE — ED Triage Notes (Addendum)
C/o HA, n/v x 4-5 hours-presents to triage in w/c-grimacing-kept eyes closed throughout triage

## 2021-03-24 ENCOUNTER — Other Ambulatory Visit: Payer: Self-pay

## 2021-03-24 ENCOUNTER — Emergency Department (HOSPITAL_BASED_OUTPATIENT_CLINIC_OR_DEPARTMENT_OTHER)
Admission: EM | Admit: 2021-03-24 | Discharge: 2021-03-24 | Disposition: A | Discharge status: Home / Self Care | Payer: BC Managed Care – PPO | Attending: Emergency Medicine | Admitting: Emergency Medicine

## 2021-03-24 ENCOUNTER — Encounter (HOSPITAL_BASED_OUTPATIENT_CLINIC_OR_DEPARTMENT_OTHER): Payer: Self-pay

## 2021-03-24 DIAGNOSIS — R0981 Nasal congestion: Secondary | ICD-10-CM | POA: Diagnosis not present

## 2021-03-24 DIAGNOSIS — Z20822 Contact with and (suspected) exposure to covid-19: Secondary | ICD-10-CM | POA: Insufficient documentation

## 2021-03-24 DIAGNOSIS — Z79899 Other long term (current) drug therapy: Secondary | ICD-10-CM | POA: Insufficient documentation

## 2021-03-24 DIAGNOSIS — I10 Essential (primary) hypertension: Secondary | ICD-10-CM | POA: Diagnosis not present

## 2021-03-24 DIAGNOSIS — J069 Acute upper respiratory infection, unspecified: Secondary | ICD-10-CM

## 2021-03-24 DIAGNOSIS — I2510 Atherosclerotic heart disease of native coronary artery without angina pectoris: Secondary | ICD-10-CM | POA: Insufficient documentation

## 2021-03-24 DIAGNOSIS — Z91040 Latex allergy status: Secondary | ICD-10-CM | POA: Diagnosis not present

## 2021-03-24 DIAGNOSIS — R059 Cough, unspecified: Secondary | ICD-10-CM | POA: Diagnosis present

## 2021-03-24 MED ORDER — BENZONATATE 100 MG PO CAPS: 100.0000 mg | ORAL_CAPSULE | Freq: Three times a day (TID) | ORAL | 0 refills | Status: AC | PRN

## 2021-03-24 NOTE — Discharge Instructions (Signed)
You were tested for COVID and flu and RSV.  Please schedule result on MyChart.  If you have COVID you will need to self isolate for at least 5 days  Take Tessalon Perles as needed for cough  Stay hydrated.  See your doctor for follow-up  Return to ER if you have worse cough, fever, trouble breathing.

## 2021-03-24 NOTE — ED Notes (Signed)
Seen before triage, SpO2 100%, HR 70, RR 16, BBS CTA, increased DOE and productive cough.

## 2021-03-24 NOTE — ED Triage Notes (Addendum)
Pt arrives ambulatory to ED with c/o "Covid symptoms since the 13th" Pt reports she had diarrhea but that has gotten better, pt c/o continued cough and fatigue. Pt speaking in full sentences in triage.

## 2021-03-24 NOTE — ED Provider Notes (Signed)
[facility] EMERGENCY DEPARTMENT Provider Note   CSN: [id number] Arrival date & time: 03/24/21  1342     History Chief Complaint  Patient presents with  . Nasal Congestion    Michele Frazier is a 45 y.o. female history of CAD, hypertension who presented with cough and nasal congestion.  Patient has been coughing for the last week or so.  Patient seen PCP last week and had a negative COVID test.  Patient was put on albuterol and Symbicort with minimal relief.  Patient states that she just feels very tired and has nonproductive cough.  Has some chills but no fever.  Denies any known COVID exposure.  The history is provided by the patient.      Past Medical History:  Diagnosis Date  . Arthritis   . Coronary artery disease   . Depression   . Hypertension   . Pneumonia     There are no problems to display for this patient.   Past Surgical History:  Procedure Laterality Date  . ABDOMINAL HYSTERECTOMY    . BUNIONECTOMY  07/05/2014  . CARDIAC CATHETERIZATION       OB History   No obstetric history on file.     Family History  Problem Relation Age of Onset  . Cancer Maternal Grandmother        Pt did not know what type  . Cancer Maternal Grandfather        Pt did not know what type  . Diabetes Mother   . Heart disease Mother   . Kidney disease Paternal Uncle        x2  . Colon cancer Neg Hx   . Colon polyps Neg Hx   . Esophageal cancer Neg Hx   . Gallbladder disease Neg Hx     Social History   Tobacco Use  . Smoking status: Never  . Smokeless tobacco: Never  Vaping Use  . Vaping Use: Never used  Substance Use Topics  . Alcohol use: Yes    Alcohol/week: 0.0 standard drinks    Comment: occ  . Drug use: No    Home Medications Prior to Admission medications   Medication Sig Start Date End Date Taking? Authorizing Provider  acetaminophen (TYLENOL) 500 MG tablet Take 500 mg by mouth every 6 (six) hours as needed for mild pain, moderate pain,  fever or headache.    [provider]  Alpha-D-Galactosidase Charlyne Quale) TABS Take 2 tablets by mouth 3 (three) times daily with meals.    [provider]  amLODipine (NORVASC) 10 MG tablet Take 10 mg by mouth daily. 08/04/14   [provider]  Cholecalciferol (VITAMIN D PO) Take 1 tablet by mouth daily.    [provider]  ibuprofen (ADVIL,MOTRIN) 800 MG tablet Take 800 mg by mouth every 8 (eight) hours as needed for fever, headache, mild pain, moderate pain or cramping.    [provider]  metoprolol succinate (TOPROL-XL) 50 MG 24 hr tablet Take 50 mg by mouth daily. 08/03/14   [provider]  polyethylene glycol (MIRALAX / GLYCOLAX) packet Take 17 g by mouth 2 (two) times daily.    [provider]  sertraline (ZOLOFT) 50 MG tablet Take 50 mg by mouth daily.    [provider]    Allergies    Latex and Vicodin [hydrocodone-acetaminophen]  Review of Systems   Review of Systems  Respiratory:  Positive for cough.   All other systems reviewed and are negative.  Physical  Exam Updated Vital Signs BP 135/86 (BP Location: Left Arm)   Pulse 60   Temp 98.3 F (36.8 C) (Oral)   Resp 18   Ht 5\' 5"  (1.651 m)   Wt 98.4 kg   SpO2 100%   BMI 36.11 kg/m   Physical Exam Vitals and nursing note reviewed.  Constitutional:      Comments: Coughing  HENT:     Head: Normocephalic.     Nose: Nose normal.     Mouth/Throat:     Mouth: Mucous membranes are moist.  Eyes:     Extraocular Movements: Extraocular movements intact.     Pupils: Pupils are equal, round, and reactive to light.  Cardiovascular:     Rate and Rhythm: Normal rate and regular rhythm.     Pulses: Normal pulses.     Heart sounds: Normal heart sounds.  Pulmonary:     Effort: Pulmonary effort is normal.     Breath sounds: Normal breath sounds.     Comments: No wheezing or crackles Abdominal:     General: Abdomen is flat.     Palpations: Abdomen is soft.   Musculoskeletal:        General: Normal range of motion.     Cervical back: Normal range of motion and neck supple.  Skin:    General: Skin is warm.     Capillary Refill: Capillary refill takes less than 2 seconds.  Neurological:     General: No focal deficit present.     Mental Status: She is alert and oriented to person, place, and time.  Psychiatric:        Mood and Affect: Mood normal.        Behavior: Behavior normal.    ED Results / Procedures / Treatments   Labs (all labs ordered are listed, but only abnormal results are displayed) Labs Reviewed  SARS CORONAVIRUS 2 (TAT 6-24 HRS)    EKG None  Radiology No results found.  Procedures Procedures   Medications Ordered in ED Medications - No data to display  ED Course  I have reviewed the triage vital signs and the nursing notes.  Pertinent labs & imaging results that were available during my care of the patient were reviewed by me and considered in my medical decision making (see chart for details).    MDM Rules/Calculators/A&P                          Michele Frazier is a 45 y.o. female here presenting with cough.  Patient has nonproductive cough.  Afebrile in the ED.  Tested negative for COVID a week ago.  No wheezing on exam.  I think likely viral syndrome.  We will retest for COVID and flu and RSV.  Will discharge home with 54 as needed   Final Clinical Impression(s) / ED Diagnoses Final diagnoses:  None    Rx / DC Orders ED Discharge Orders     None        Jerilynn Som, MD 03/24/21 1642

## 2021-03-25 LAB — SARS CORONAVIRUS 2 (TAT 6-24 HRS): SARS Coronavirus 2: NEGATIVE

## 2024-07-20 ENCOUNTER — Other Ambulatory Visit: Payer: Self-pay

## 2024-07-20 ENCOUNTER — Encounter (HOSPITAL_BASED_OUTPATIENT_CLINIC_OR_DEPARTMENT_OTHER): Payer: Self-pay | Admitting: Emergency Medicine

## 2024-07-20 ENCOUNTER — Emergency Department (HOSPITAL_BASED_OUTPATIENT_CLINIC_OR_DEPARTMENT_OTHER)

## 2024-07-20 ENCOUNTER — Emergency Department (HOSPITAL_BASED_OUTPATIENT_CLINIC_OR_DEPARTMENT_OTHER)
Admission: EM | Admit: 2024-07-20 | Discharge: 2024-07-20 | Disposition: A | Discharge status: Home / Self Care | Attending: Emergency Medicine | Admitting: Emergency Medicine

## 2024-07-20 DIAGNOSIS — M25562 Pain in left knee: Secondary | ICD-10-CM | POA: Insufficient documentation

## 2024-07-20 DIAGNOSIS — I10 Essential (primary) hypertension: Secondary | ICD-10-CM | POA: Diagnosis not present

## 2024-07-20 DIAGNOSIS — Z79899 Other long term (current) drug therapy: Secondary | ICD-10-CM | POA: Insufficient documentation

## 2024-07-20 DIAGNOSIS — Z91040 Latex allergy status: Secondary | ICD-10-CM | POA: Insufficient documentation

## 2024-07-20 DIAGNOSIS — I2510 Atherosclerotic heart disease of native coronary artery without angina pectoris: Secondary | ICD-10-CM | POA: Insufficient documentation

## 2024-07-20 MED ORDER — MELOXICAM 7.5 MG PO TABS: 7.5000 mg | ORAL_TABLET | Freq: Every day | ORAL | 0 refills | Status: AC

## 2024-07-20 MED ORDER — LIDOCAINE 5 % EX PTCH: 1.0000 | MEDICATED_PATCH | CUTANEOUS | 0 refills | Status: AC

## 2024-07-20 MED ORDER — LIDOCAINE 5 % EX PTCH
1.0000 | MEDICATED_PATCH | Freq: Once | CUTANEOUS | Status: DC
  Administered 2024-07-20: 1 via TRANSDERMAL
  Filled 2024-07-20: qty 1

## 2024-07-20 NOTE — ED Provider Notes (Signed)
 " [facility] Provider Note   CSN: [id number] Arrival date & time: 07/20/24  2003     Patient presents with: Knee Injury   Michele Frazier is a 49 y.o. female.   Patient is a 49 year old who presents with pain in her left knee.  She said on December 28 she tripped on a curb and fell straight down onto the knee.  She has been having pain since that time.  It has recently worsened over the last week or so.  She denies any prior injuries to the leg.  No other injuries from the fall.       Prior to Admission medications  Medication Sig Start Date End Date Taking? Authorizing Provider  lidocaine  (LIDODERM ) 5 % Place 1 patch onto the skin daily. Remove & Discard patch within 12 hours or as directed by MD 07/20/24  Yes Lenor Hollering, MD  meloxicam  (MOBIC ) 7.5 MG tablet Take 1 tablet (7.5 mg total) by mouth daily. 07/20/24  Yes Lenor Hollering, MD  acetaminophen  (TYLENOL ) 500 MG tablet Take 500 mg by mouth every 6 (six) hours as needed for mild pain, moderate pain, fever or headache.    [provider]  Alpha-D-Galactosidase THAD) TABS Take 2 tablets by mouth 3 (three) times daily with meals.    [provider]  amLODipine (NORVASC) 10 MG tablet Take 10 mg by mouth daily. 08/04/14   [provider]  benzonatate  (TESSALON ) 100 MG capsule Take 1 capsule (100 mg total) by mouth 3 (three) times daily as needed for cough. 03/24/21   Patt Alm Macho, MD  Cholecalciferol (VITAMIN D PO) Take 1 tablet by mouth daily.    [provider]  metoprolol succinate (TOPROL-XL) 50 MG 24 hr tablet Take 50 mg by mouth daily. 08/03/14   [provider]  polyethylene glycol (MIRALAX / GLYCOLAX) packet Take 17 g by mouth 2 (two) times daily.    [provider]  sertraline (ZOLOFT) 50 MG tablet Take 50 mg by mouth daily.    [provider]    Allergies: Latex and Vicodin [hydrocodone-acetaminophen ]    Review  of Systems  Constitutional:  Negative for fever.  Gastrointestinal:  Negative for nausea and vomiting.  Musculoskeletal:  Positive for arthralgias. Negative for back pain, joint swelling and neck pain.  Skin:  Negative for wound.  Neurological:  Negative for weakness, numbness and headaches.    Updated Vital Signs BP (!) 140/89 (BP Location: Left Arm)   Pulse 95   Temp 98.3 F (36.8 C) (Oral)   Resp (!) 26   Ht 5' 5 (1.651 m)   Wt 93.9 kg   SpO2 98%   BMI 34.45 kg/m   Physical Exam Constitutional:      Appearance: She is well-developed.  HENT:     Head: Normocephalic and atraumatic.  Cardiovascular:     Rate and Rhythm: Normal rate.  Pulmonary:     Effort: Pulmonary effort is normal.  Musculoskeletal:        General: Tenderness present.     Cervical back: Normal range of motion and neck supple.     Comments: Positive tenderness to the anterior and lateral aspect of the left knee.  There is no swelling or deformity noted.  No warmth or erythema.  No gross ligament laxity.  She is able to do a straight leg raise.  There is no pain in the hip or ankle.  Pedal pulses are intact.  She  has normal sensation and motor function distally.  Skin:    General: Skin is warm and dry.  Neurological:     Mental Status: She is alert and oriented to person, place, and time.     (all labs ordered are listed, but only abnormal results are displayed) Labs Reviewed - No data to display  EKG: None  Radiology: DG Knee Complete 4 Views Left Result Date: 07/20/2024 EXAM: 4 OR MORE VIEW(S) XRAY OF THE LEFT KNEE 07/20/2024 08:50:34 PM COMPARISON: None available. CLINICAL HISTORY: injury injury injury injury injury FINDINGS: BONES AND JOINTS: No acute fracture. No malalignment. Possible small suprapatellar knee joint effusion. SOFT TISSUES: Unremarkable. IMPRESSION: 1. No fracture or dislocation. Electronically signed by: Pinkie Pebbles MD 07/20/2024 08:57 PM EST RP Workstation: [id number]      Procedures   Medications Ordered in the ED  lidocaine  (LIDODERM ) 5 % 1 patch (has no administration in time range)                                    Medical Decision Making Amount and/or Complexity of Data Reviewed Radiology: ordered.  Risk Prescription drug management.   This patient presents to the ED for concern of knee pain, this involves an extensive number of treatment options, and is a complaint that carries with it a high risk of complications and morbidity.  I considered the following differential and admission for this acute, potentially life threatening condition.  The differential diagnosis includes fracture, ligament injury, cartilage tear, bone mass, septic joint, DVT  MDM:    Patient is a 49 year old who presents with pain to her left knee.  X-rays were performed which do not show any evidence of bony injury.  I do not appreciate any joint effusion or significant swelling to the area.  There is no gross ligament laxity which would be more concerning for ligament injury.  She does not have any pain or swelling in the lower leg which would be more concerning for DVT.  Discussed options of treatment.  Will start Lidoderm  patches on the lateral aspect of the knee which seems to be the most painful part.  She has been taking ibuprofen.  Discussed switching to Mobic  for short period time which she is amenable to.  She told me that she does not have an orthopedist so I will refer her to the orthopedist on-call.  She was advised to call make an appointment to follow-up with orthopedist.  She was discharged home in good condition.  Return precautions were given.  (Labs, imaging, consults)  Labs: I Ordered, and personally interpreted labs.  The pertinent results include:     Imaging Studies ordered: I ordered imaging studies including left knee I independently visualized and interpreted imaging. I agree with the radiologist interpretation  Additional history obtained  from  .  External records from outside source obtained and reviewed including prior notes  Cardiac Monitoring: The patient was not maintained on a cardiac monitor.  If on the cardiac monitor, I personally viewed and interpreted the cardiac monitored which showed an underlying rhythm of:    Reevaluation: After the interventions noted above, I reevaluated the patient and found that they have :improved  Social Determinants of Health:    Disposition: Discharged home  Co morbidities that complicate the patient evaluation  Past Medical History:  Diagnosis Date   Arthritis    Coronary artery disease    Depression  Hypertension    Pneumonia      Medicines Meds ordered this encounter  Medications   lidocaine  (LIDODERM ) 5 % 1 patch   meloxicam  (MOBIC ) 7.5 MG tablet    Sig: Take 1 tablet (7.5 mg total) by mouth daily.    Dispense:  10 tablet    Refill:  0   lidocaine  (LIDODERM ) 5 %    Sig: Place 1 patch onto the skin daily. Remove & Discard patch within 12 hours or as directed by MD    Dispense:  30 patch    Refill:  0    I have reviewed the patients home medicines and have made adjustments as needed  Problem List / ED Course: Problem List Items Addressed This Visit   None Visit Diagnoses       Acute pain of left knee    -  Primary                Final diagnoses:  Acute pain of left knee    ED Discharge Orders          Ordered    meloxicam  (MOBIC ) 7.5 MG tablet  Daily        07/20/24 2210    lidocaine  (LIDODERM ) 5 %  Every 24 hours        07/20/24 2210               Lenor Hollering, MD 07/20/24 2215  "

## 2024-07-20 NOTE — Discharge Instructions (Addendum)
Follow-up with the orthopedist as discussed.  Return to the emergency room if you have any worsening symptoms. ?

## 2024-07-20 NOTE — ED Triage Notes (Signed)
 Left knee injury, fell on a curb Dec 28th. Has been following RICE at home, Minimal effect.
